# Patient Record
Sex: Female | Born: 1985
Health system: Southern US, Community
[De-identification: ages and names within clinical notes are randomized; demographics above are authoritative.]

## PROBLEM LIST (undated history)

## (undated) DIAGNOSIS — N159 Renal tubulo-interstitial disease, unspecified: Secondary | ICD-10-CM

## (undated) DIAGNOSIS — T7840XA Allergy, unspecified, initial encounter: Secondary | ICD-10-CM

## (undated) DIAGNOSIS — N809 Endometriosis, unspecified: Secondary | ICD-10-CM

## (undated) DIAGNOSIS — J45909 Unspecified asthma, uncomplicated: Secondary | ICD-10-CM

## (undated) DIAGNOSIS — E119 Type 2 diabetes mellitus without complications: Secondary | ICD-10-CM

## (undated) HISTORY — PX: HYSTEROSCOPY: SHX211

## (undated) HISTORY — DX: Allergy, unspecified, initial encounter: T78.40XA

## (undated) HISTORY — PX: LAPAROSCOPY: SHX197

---
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
## 2006-01-07 ENCOUNTER — Observation Stay: Payer: Self-pay

## 2006-01-09 ENCOUNTER — Inpatient Hospital Stay: Payer: Self-pay | Admitting: Obstetrics & Gynecology

## 2006-03-15 ENCOUNTER — Emergency Department: Payer: Self-pay

## 2007-02-28 ENCOUNTER — Emergency Department: Payer: Self-pay | Admitting: Emergency Medicine

## 2007-07-05 ENCOUNTER — Emergency Department: Payer: Self-pay | Admitting: Emergency Medicine

## 2007-07-07 ENCOUNTER — Emergency Department: Payer: Self-pay | Admitting: Emergency Medicine

## 2007-10-09 ENCOUNTER — Emergency Department: Payer: Self-pay | Admitting: Emergency Medicine

## 2007-10-26 ENCOUNTER — Emergency Department: Payer: Self-pay | Admitting: Emergency Medicine

## 2008-03-25 ENCOUNTER — Emergency Department: Payer: Self-pay | Admitting: Emergency Medicine

## 2008-03-30 ENCOUNTER — Encounter: Payer: Self-pay | Admitting: Obstetrics & Gynecology

## 2008-04-06 ENCOUNTER — Encounter: Payer: Self-pay | Admitting: Obstetrics and Gynecology

## 2008-05-04 ENCOUNTER — Encounter: Payer: Self-pay | Admitting: Obstetrics & Gynecology

## 2008-05-25 ENCOUNTER — Emergency Department: Payer: Self-pay

## 2008-06-08 ENCOUNTER — Encounter: Payer: Self-pay | Admitting: Maternal and Fetal Medicine

## 2008-06-22 ENCOUNTER — Emergency Department: Payer: Self-pay | Admitting: Emergency Medicine

## 2008-07-27 ENCOUNTER — Observation Stay: Payer: Self-pay

## 2008-08-27 ENCOUNTER — Encounter: Payer: Self-pay | Admitting: Obstetrics and Gynecology

## 2008-09-02 ENCOUNTER — Observation Stay: Payer: Self-pay | Admitting: Obstetrics and Gynecology

## 2008-09-29 ENCOUNTER — Observation Stay: Payer: Self-pay | Admitting: Obstetrics and Gynecology

## 2008-10-01 ENCOUNTER — Observation Stay: Payer: Self-pay

## 2008-10-11 ENCOUNTER — Observation Stay: Payer: Self-pay

## 2008-10-13 ENCOUNTER — Observation Stay: Payer: Self-pay | Admitting: Obstetrics & Gynecology

## 2008-10-22 ENCOUNTER — Observation Stay: Payer: Self-pay | Admitting: Unknown Physician Specialty

## 2008-11-03 ENCOUNTER — Inpatient Hospital Stay: Payer: Self-pay

## 2010-12-04 ENCOUNTER — Emergency Department: Payer: Self-pay | Admitting: Emergency Medicine

## 2011-02-02 IMAGING — US US OB NUCHAL TRANSLUCENCY 1ST GEST - [facility] NRPT
1 series · 14 of 26 positions shown · non-contrast
Comparison: none

[Series 1: us ob nuchal translucency 1st gest - [facility] nrpt · 14 of 26 slices shown]
[im 1/26]
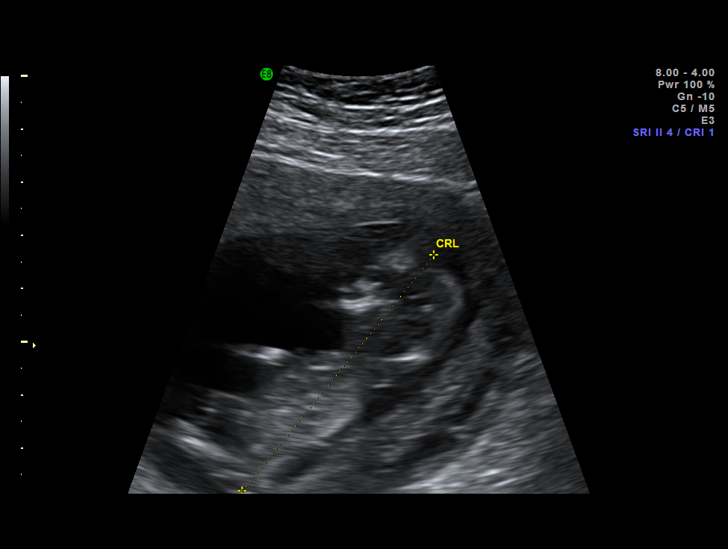
[im 3/26]
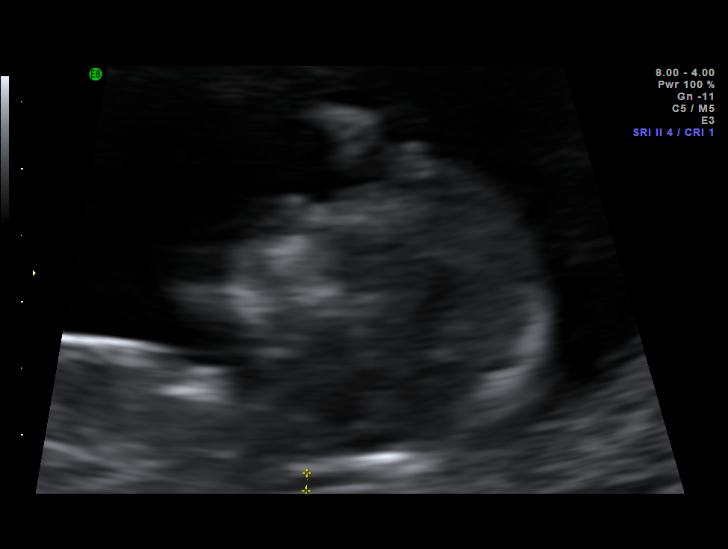
[im 5/26]
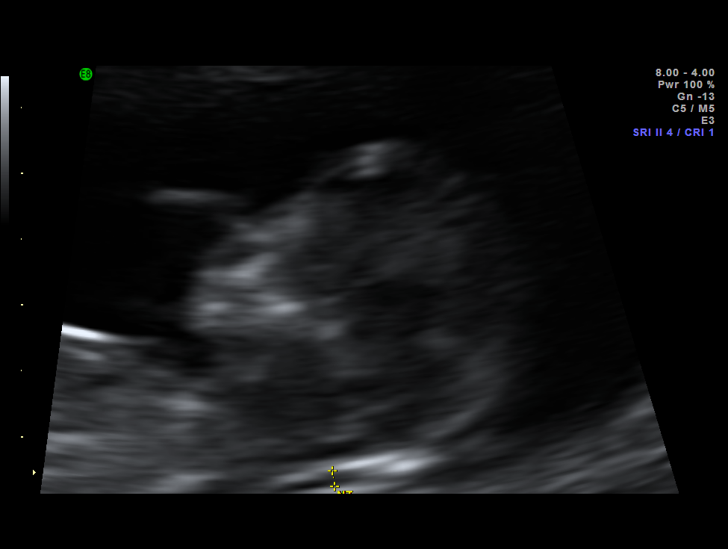
[im 7/26]
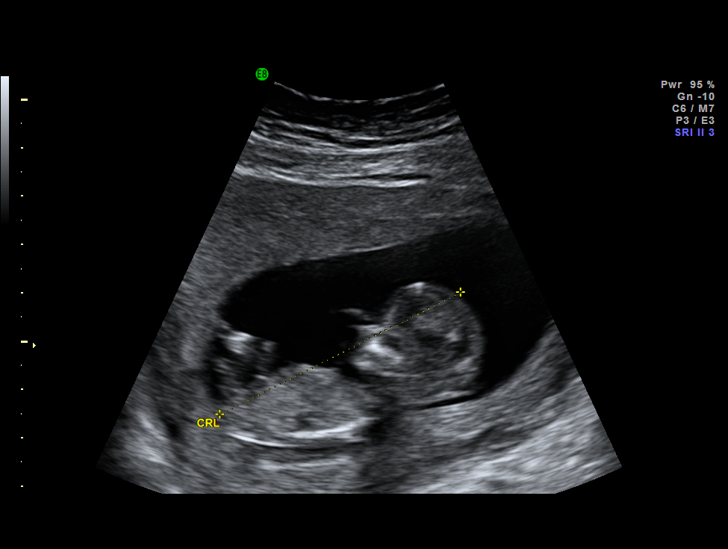
[im 9/26]
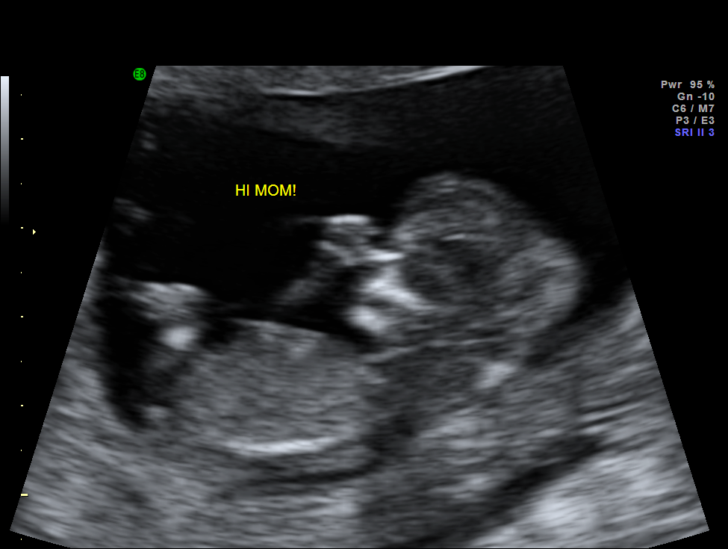
[im 11/26]
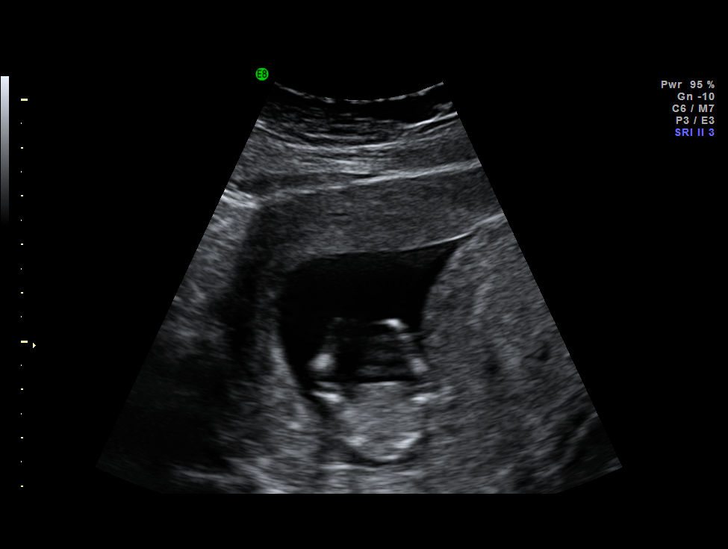
[im 13/26]
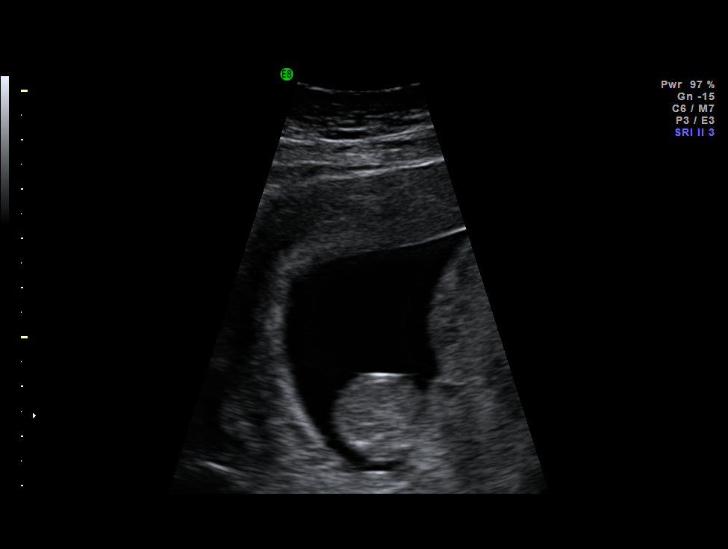
[im 14/26]
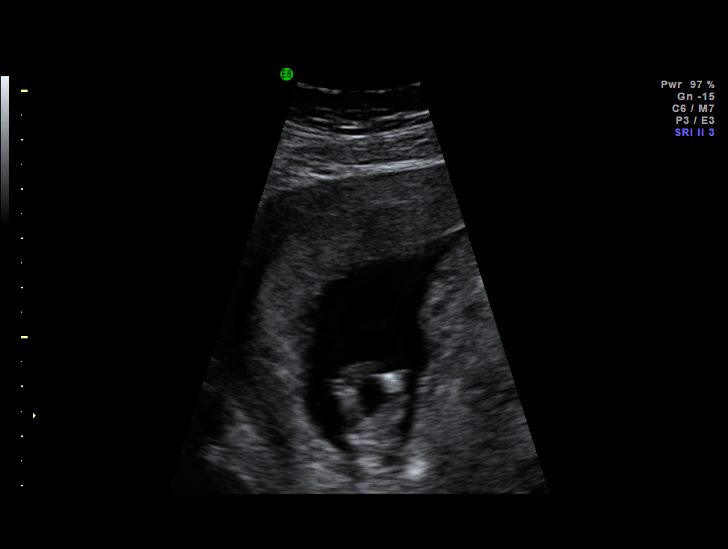
[im 16/26]
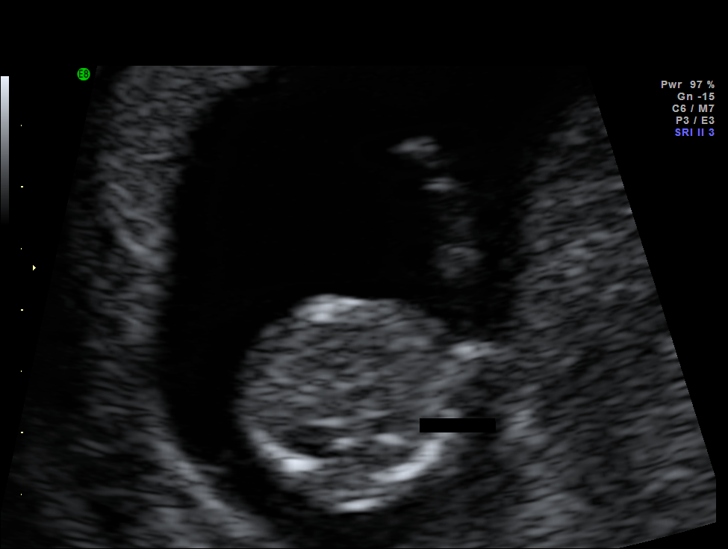
[im 18/26]
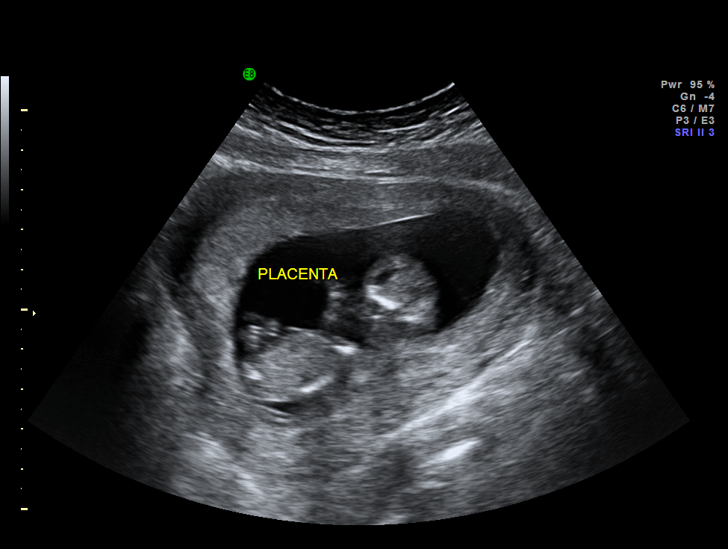
[im 20/26]
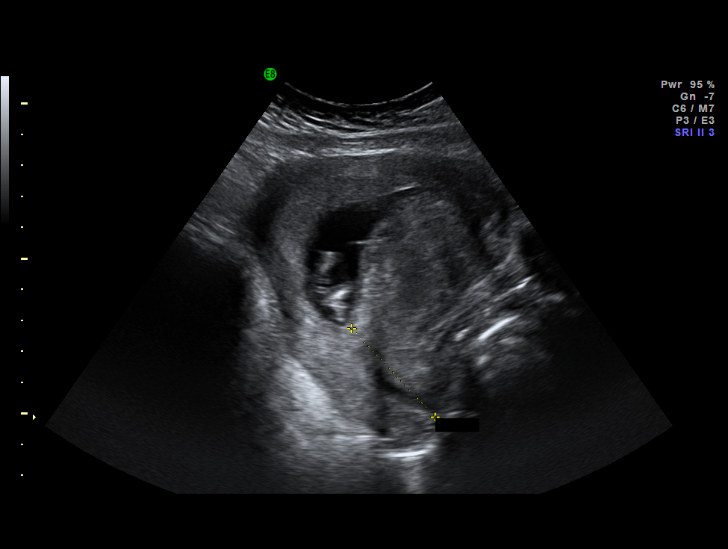
[im 22/26]
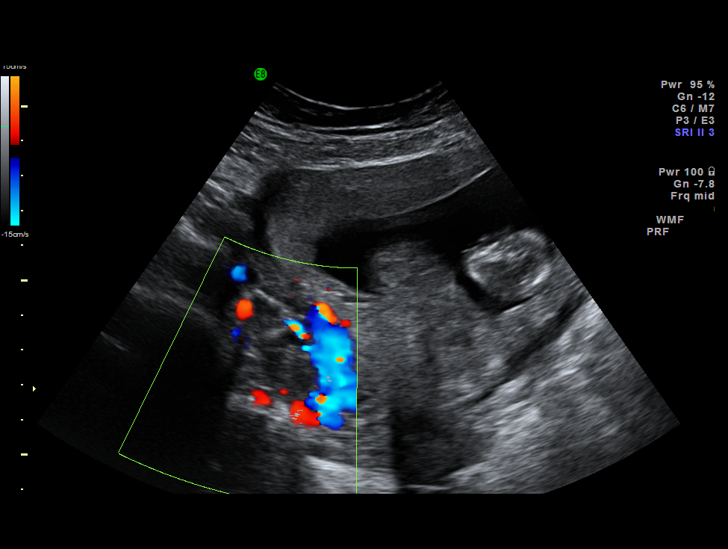
[im 24/26]
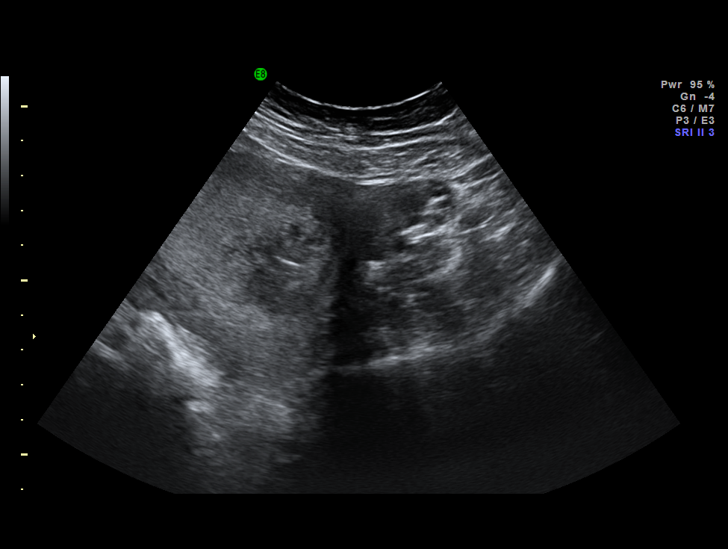
[im 26/26]
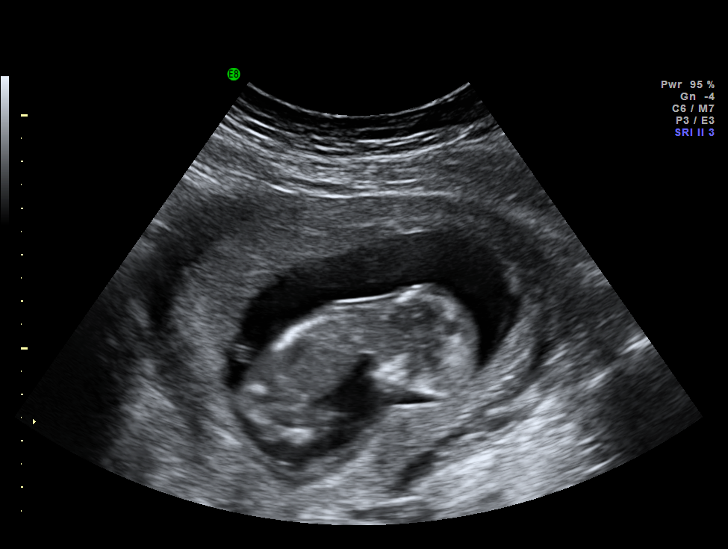

[14 of 26 positions shown; findings below may reference images not displayed]

IMAGES IMPORTED FROM THE [employer] WORKFLOW SYSTEM
NO DICTATION FOR STUDY

## 2011-03-18 ENCOUNTER — Emergency Department: Payer: Self-pay | Admitting: Emergency Medicine

## 2011-03-18 LAB — URINALYSIS, COMPLETE
Bacteria: NONE SEEN
Bilirubin,UR: NEGATIVE
Glucose,UR: NEGATIVE mg/dL (ref 0–75)
RBC,UR: 4 /HPF (ref 0–5)
Specific Gravity: 1.027 (ref 1.003–1.030)
Squamous Epithelial: 5
WBC UR: 5 /HPF (ref 0–5)

## 2011-03-19 LAB — WET PREP, GENITAL

## 2012-01-16 ENCOUNTER — Emergency Department: Payer: Self-pay | Admitting: Emergency Medicine

## 2012-01-16 LAB — RAPID INFLUENZA A&B ANTIGENS

## 2012-05-30 ENCOUNTER — Emergency Department: Payer: Self-pay | Admitting: Emergency Medicine

## 2012-05-30 LAB — COMPREHENSIVE METABOLIC PANEL
Albumin: 3.9 g/dL (ref 3.4–5.0)
Alkaline Phosphatase: 65 U/L (ref 50–136)
BUN: 16 mg/dL (ref 7–18)
Chloride: 108 mmol/L — ABNORMAL HIGH (ref 98–107)
Creatinine: 0.68 mg/dL (ref 0.60–1.30)
EGFR (Non-African Amer.): >60
Glucose: 97 mg/dL (ref 65–99)
Osmolality: 284 (ref 275–301)
Potassium: 3.8 mmol/L (ref 3.5–5.1)
SGOT(AST): 24 U/L (ref 15–37)
SGPT (ALT): 25 U/L (ref 12–78)
Total Protein: 7.5 g/dL (ref 6.4–8.2)

## 2012-05-30 LAB — URINALYSIS, COMPLETE
Glucose,UR: NEGATIVE mg/dL (ref 0–75)
Protein: 100
RBC,UR: 945 /HPF (ref 0–5)
Specific Gravity: 1.013 (ref 1.003–1.030)
Squamous Epithelial: 2
WBC UR: 197 /HPF (ref 0–5)

## 2012-05-30 LAB — LIPASE, BLOOD: Lipase: 156 U/L (ref 73–393)

## 2012-05-31 LAB — CBC
HCT: 37.2 % (ref 35.0–47.0)
HGB: 12.7 g/dL (ref 12.0–16.0)
MCH: 30 pg (ref 26.0–34.0)
MCV: 88 fL (ref 80–100)
Platelet: 243 10*3/uL (ref 150–440)
RDW: 13.4 % (ref 11.5–14.5)

## 2012-08-12 ENCOUNTER — Emergency Department: Payer: Self-pay | Admitting: Emergency Medicine

## 2012-08-12 LAB — COMPREHENSIVE METABOLIC PANEL
Albumin: 4 g/dL (ref 3.4–5.0)
Alkaline Phosphatase: 60 U/L (ref 50–136)
Bilirubin,Total: 0.3 mg/dL (ref 0.2–1.0)
Calcium, Total: 9.3 mg/dL (ref 8.5–10.1)
Co2: 30 mmol/L (ref 21–32)
Creatinine: 0.7 mg/dL (ref 0.60–1.30)
EGFR (African American): >60
EGFR (Non-African Amer.): >60
Glucose: 88 mg/dL (ref 65–99)
Potassium: 3.9 mmol/L (ref 3.5–5.1)
SGOT(AST): 23 U/L (ref 15–37)
SGPT (ALT): 22 U/L (ref 12–78)
Sodium: 137 mmol/L (ref 136–145)
Total Protein: 7.4 g/dL (ref 6.4–8.2)

## 2012-08-12 LAB — URINALYSIS, COMPLETE
Bilirubin,UR: NEGATIVE
Ketone: NEGATIVE
Ph: 6 (ref 4.5–8.0)
RBC,UR: 6 /HPF (ref 0–5)
Specific Gravity: 1.006 (ref 1.003–1.030)
Squamous Epithelial: 13
WBC UR: 12 /HPF (ref 0–5)

## 2012-08-12 LAB — CBC
HCT: 36.7 % (ref 35.0–47.0)
HGB: 12.8 g/dL (ref 12.0–16.0)
MCH: 30.7 pg (ref 26.0–34.0)
MCHC: 34.9 g/dL (ref 32.0–36.0)
Platelet: 200 10*3/uL (ref 150–440)
RBC: 4.18 10*6/uL (ref 3.80–5.20)
RDW: 12.9 % (ref 11.5–14.5)
WBC: 7.3 10*3/uL (ref 3.6–11.0)

## 2013-06-02 ENCOUNTER — Emergency Department: Payer: Self-pay | Admitting: Emergency Medicine

## 2013-06-02 LAB — URINALYSIS, COMPLETE
BLOOD: NEGATIVE
Bilirubin,UR: NEGATIVE
Glucose,UR: NEGATIVE mg/dL (ref 0–75)
Ketone: NEGATIVE
Leukocyte Esterase: NEGATIVE
Nitrite: NEGATIVE
PH: 6 (ref 4.5–8.0)
Protein: NEGATIVE
RBC,UR: NONE SEEN /HPF (ref 0–5)
Specific Gravity: 1.003 (ref 1.003–1.030)
WBC UR: <1 /HPF (ref 0–5)

## 2013-06-02 LAB — COMPREHENSIVE METABOLIC PANEL
ALBUMIN: 4.2 g/dL (ref 3.4–5.0)
Alkaline Phosphatase: 60 U/L
Anion Gap: 5 — ABNORMAL LOW (ref 7–16)
BUN: 15 mg/dL (ref 7–18)
Bilirubin,Total: 0.5 mg/dL (ref 0.2–1.0)
CHLORIDE: 103 mmol/L (ref 98–107)
CO2: 30 mmol/L (ref 21–32)
CREATININE: 0.64 mg/dL (ref 0.60–1.30)
Calcium, Total: 9.5 mg/dL (ref 8.5–10.1)
EGFR (African American): >60
Glucose: 88 mg/dL (ref 65–99)
Osmolality: 276 (ref 275–301)
Potassium: 3.7 mmol/L (ref 3.5–5.1)
SGOT(AST): 17 U/L (ref 15–37)
SGPT (ALT): 18 U/L (ref 12–78)
Sodium: 138 mmol/L (ref 136–145)
Total Protein: 7.6 g/dL (ref 6.4–8.2)

## 2013-06-02 LAB — CBC
HCT: 39.6 % (ref 35.0–47.0)
HGB: 13.4 g/dL (ref 12.0–16.0)
MCH: 30 pg (ref 26.0–34.0)
MCHC: 33.8 g/dL (ref 32.0–36.0)
MCV: 89 fL (ref 80–100)
PLATELETS: 200 10*3/uL (ref 150–440)
RBC: 4.46 10*6/uL (ref 3.80–5.20)
RDW: 13.1 % (ref 11.5–14.5)
WBC: 4.2 10*3/uL (ref 3.6–11.0)

## 2013-06-02 LAB — WET PREP, GENITAL

## 2013-06-04 LAB — URINE CULTURE

## 2014-02-10 ENCOUNTER — Emergency Department: Payer: Self-pay | Admitting: Emergency Medicine

## 2014-03-07 ENCOUNTER — Inpatient Hospital Stay: Payer: Self-pay | Admitting: Internal Medicine

## 2014-05-03 NOTE — Discharge Summary (Signed)
PATIENT NAME:  Nichole Boyd, Nichole Boyd MR#:  [id number] DATE OF BIRTH:  [birth year]  DATE OF ADMISSION:  03/07/2014 DATE OF DISCHARGE:  03/09/2014  PRIMARY CARE PHYSICIAN: Dr. Dr. Yetta BarreJones.  DISCHARGE DIAGNOSES:  1.  Left maxillofacial cellulitis following wisdom teeth extraction.  2.  Anxiety.   CONDITION: Stable.   CODE STATUS: Full code.   HOME MEDICATIONS: Please refer to the medication reconciliation list.   DIET: Low-fat, low-cholesterol diet.   ACTIVITY: As tolerated.   FOLLOWUP CARE:  With PCP within 1-2 weeks.   REASON FOR ADMISSION:  Pain and swelling on the left side of the face following wisdom teeth extraction, 24 hours.   HOSPITAL COURSE: The patient is a [age] year old Caucasian female with a history of anxiety and endometriosis, presented to the ED with pain and swelling on the left side of face. For detailed history and physical examination, please refer to the admission note dictated by Dr. Betti Cruzeddy.   The patient was found to have significant swelling and tenderness on the left side of the face extending to upper part of the neck.  CAT scan of facial, neck survey, recent, revealed significant soft tissue swelling consistent with cellulitis and negative for any abscess or Ludwig angina. The patient's WBC was normal. On-call ENT physician suggested the patient needs admission and start IV antibiotics and Decadron. After admission, the patient has been treated with clindamycin IV and Decadron.  The patient also has been treated with pain medication p.r.Boyd.   The patient's swelling and tenderness on the left face has much improved, she still has some mild tenderness and swelling on the left side of the face, but no neck tenderness or periorbital tenderness.  No dysphagia or shortness of breath or slurred speech. The patient's vital signs are stable.  She is clinically stable and will be discharged to home today. I discussed the discharge plan with the patient and the patient's mother and the  nurse.   TIME SPENT: About 32 minutes.     ____________________________ Shaune PollackQing Sammye Staff, MD qc:LT D: 03/09/2014 16:01:58 ET T: 03/09/2014 19:21:08 ET JOB#: [id number]  cc: Shaune PollackQing Kiaira Pointer, MD, <Dictator> Shaune PollackQING Erminio Nygard MD ELECTRONICALLY SIGNED 03/11/2014 14:26

## 2014-05-03 NOTE — H&P (Signed)
PATIENT NAME:  Nichole Boyd, Nichole Boyd MR#:  [id number] DATE OF BIRTH:  20-Jul-1985  DATE OF ADMISSION:  03/07/2014  REFERRING DOCTOR:  Eartha Inchory R. York CeriseForbach, MD    PRIMARY CARE PRACTITIONER:  Dr. Yetta BarreJones    ADMITTING DOCTOR:  Crissie FiguresEdavally Boyd. Sama Arauz, MD    CHIEF COMPLAINT: Pain and swelling of the left side of the face following wisdom teeth extraction 24 hours ago.    HISTORY OF PRESENT ILLNESS: A 29 year old Caucasian female with a history of anxiety disorder and endometriosis presents to the Emergency Room with the complaints of worsening pain with associated swelling on the left side of the face following wisdom teeth extraction, which was done about 24 hours ago. According to the patient's mother who is with patient at this time, patient underwent bilateral wisdom teeth extraction a day prior and following which she started having pain and swelling on the left side of the face which further worsened today, hence came to the Emergency Room for further evaluation.   In the Emergency Room, the patient was evaluated by the ED physician and was found to have significant swelling and tenderness of the left side of the face extending into the upper part of the neck.  The patient on further workup with a CT scan of the facial maxillary region revealed significant soft tissue swelling consistent with cellulitis and negative for any abscess or Ludwig angina. Lab work essentially had normal white blood cell count. ENT on-call was consulted by the ED physician and who recommended the patient to be admitted to the hospitalist team for administration of IV antibiotics, IV Decadron, and pain control measures at least for 24 hours. Hence, hospitalist service was consulted for further evaluation and management. The patient denies any fever or chills. No chest pain. No shortness of breath. No nausea, vomiting, diarrhea. No abdominal pain. No urinary symptoms. The patient has significant left facial swelling and not able to open her  mouth completely and limited in speech because of the significant pain. The patient received IV pain control medications and the pain is under reasonable control at this time.   PAST MEDICAL HISTORY:  1.  Anxiety disorder.  2.  Endometriosis.   PAST SURGICAL HISTORY:  1.  Hysteroscopy.  2.  Laparoscopic.   ALLERGIES: AMOXICILLIN, WHICH CAUSES HIVES.   HOME MEDICATIONS: Used some medications for depression in the past but currently off medications for her anxiety, depression.    FAMILY HISTORY: Significant for father with coronary artery disease who died at the age of 28 years age.   SOCIAL HISTORY: She is single, works as a Public affairs consultantfinance consultant.History of smoking in the past, quit about 2 years ago. Occasional alcohol intake history. Negative for any substance abuse.    REVIEW OF SYSTEMS:  CONSTITUTIONAL: Negative for fever or chills. No fatigue. No generalized weakness.  EYES: Negative for blurred vision, double vision. No pain. No redness. No discharge.  EARS, NOSE, AND THROAT: Positive for pain and significant swelling of the left side of the face with inability to open the mouth widely secondary due to pain.   RESPIRATORY: Negative for cough, wheezing, dyspnea, hemoptysis, painful respiration.  CARDIOVASCULAR: Negative for chest pain, palpitation, dizziness, syncopal episodes, orthopnea, dyspnea on exertion, pedal edema.  GASTROINTESTINAL: Negative for nausea, vomiting, diarrhea, abdominal pain, hematemesis, melena, rectal bleeding, GERD symptoms.   GENITOURINARY: Negative for dysuria, frequency, urgency, hematuria.  ENDOCRINE: Negative for polyuria, nocturia, heat or cold intolerance.  HEMATOLOGY AND LYMPHATICS: Negative for anemia, easy bruising, bleeding.  INTEGUMENTARY:  Negative for acne, skin rash, or lesions.  MUSCULOSKELETAL: Pain on the left side of the face as noted in the history of present illness. No history of arthritis or gout.  NEUROLOGICAL: Negative for focal weakness  or numbness. No history of CVA, TIA, seizure disorder.  PSYCHIATRIC: Positive for anxiety disorder, not on any medications at present.   PHYSICAL EXAMINATION:  VITAL SIGNS: Temperature 98.8 degrees Fahrenheit, pulse rate 95 per minute, respirations 20 per minute, blood pressure 110/62, O2 saturation is 90% on room air.  GENERAL: Well developed, well nourished, alert, in moderate distress because of the facial pain.  HEAD: Atraumatic, normocephalic.  EYES: Pupils equal, react to light and accommodation. No conjunctival pallor. No icterus. Extraocular movements intact.  NOSE: No drainage. No lesions.  EARS: No drainage. No external lesions.   ORAL CAVITY: Able to open the mouth limited because of the pain. Detailed examination not possible of the oral cavity because of patient's inability to open the mouth wide.  NECK: Supple. Facial swelling extending to upper part of the neck. Mild local tenderness present. No carotid bruit. Range of motion restricted because of the facial pain.  RESPIRATORY: Good respiratory effort. Not using accessory muscles of respiration. Bilateral vesicular breath sounds present. No rales or rhonchi.  CARDIOVASCULAR: S1, S2 regular. No murmurs, gallops, clicks. Peripheral pulses equal at carotid, femoral, and pedal pulses. No peripheral edema.  GASTROINTESTINAL: Abdomen soft, nontender. No hepatosplenomegaly. No masses. No rigidity. No guarding. Bowel sounds present and equal in all 4 quadrants.  GENITOURINARY: Deferred.  MUSCULOSKELETAL: No joint tenderness or effusion. Range of motion adequate. Strength and tone equal bilaterally.  SKIN: Inspection within normal limits. No obvious wounds.  LYMPHATIC: No cervical lymphadenopathy.  VASCULAR: Good dorsalis pedis, posterior tibial pulses.  NEUROLOGICAL: Alert, awake, and oriented x 3. Cranial nerves II through XII grossly intact. No sensory deficit. Motor strength 5/5 in both upper and lower extremities. Plantars downgoing.   PSYCHIATRIC: Alert, awake, and oriented x 3. Judgment, insight adequate. Memory, mood within normal limits.   ANCILLARY DATA:  LABORATORY DATA: Urine pregnancy  negative. Serum glucose 93, BUN 7, creatinine 0.83, potassium 3.6, chloride 106, bicarbonate 30, total calcium 8.4, total protein 6.6, albumin 3.3, total bilirubin 0.3, alkaline phosphatase 43, AST 20, ALT 18, WBC 9.4, hemoglobin 10.9, platelet count 192.  IMAGING STUDIES: CT maxillofacial area with contrast:  1.  Scattered soft tissue swelling from the left maxillary wisdom tooth extraction defect over the left maxilla with diffuse left-sided facial swelling. Soft tissue swelling extending inferiorly to the level of the chin and superiorly to the level of the left zygomatic arch. No evidence of abscess.  2.  Mildly diffuse soft tissue inflammation noted at the parapharyngeal fat planes.  3.  Right maxillary wisdom tooth extraction defect is grossly unremarkable in appearance. There is minimal right-sided soft tissue edema of buccal fat pad.   ASSESSMENT AND PLAN: A 29 year old Caucasian female with a history of anxiety disorder, endometriosis presents with the complaints of pain and swelling of the left side of the face 24 hours following bilateral wisdom teeth extraction, found to have cellulitis on the computed tomography scan with no abscess and Ludwig angina.   1.  Left maxillofacial cellulitis following wisdom teeth extraction. CT consistent with cellulitis, negative for abscess, and Ludwig angina.  ED physician spoke with ENT on-call and advised admission for IV antibiotics and IV Decadron and IV pain control medications for 24 hours. Plan: Admit to medical floor, IV antibiotics, ceftriaxone and clindamycin,  IV Decadron q. 8 hours and IV pain control medications. Per ENT, may change the antibiotics to oral after 24 hours if improvement is there. Consider repeat CT scan of the face, facial maxillary region if no improvement or worsening of  symptoms, and according to the ED physician, ENT recommends not to put in a formal consult at this time unless, until there is worsening of symptoms or no improvement with the treatment.   2.  Deep vein thrombosis prophylaxis, subcutaneous Lovenox.  3.  Gastrointestinal prophylaxis with ranitidine.   CODE STATUS: Full code.   TIME SPENT: 40 minutes.   ____________________________ Crissie Figures, MD enr:AT D: 03/07/2014 06:31:33 ET T: 03/07/2014 06:53:39 ET JOB#: [id number]  cc: Crissie Figures, MD, <Dictator> Dr. Madelaine Bhat Annesha Delgreco MD ELECTRONICALLY SIGNED 03/08/2014 7:04

## 2015-05-17 ENCOUNTER — Encounter: Payer: Self-pay | Admitting: *Deleted

## 2015-05-17 ENCOUNTER — Emergency Department
Admission: EM | Admit: 2015-05-17 | Discharge: 2015-05-17 | Disposition: A | Discharge status: Home / Self Care | Payer: BLUE CROSS/BLUE SHIELD | Attending: Emergency Medicine | Admitting: Emergency Medicine

## 2015-05-17 ENCOUNTER — Emergency Department: Payer: BLUE CROSS/BLUE SHIELD

## 2015-05-17 DIAGNOSIS — R1084 Generalized abdominal pain: Secondary | ICD-10-CM | POA: Diagnosis present

## 2015-05-17 DIAGNOSIS — K529 Noninfective gastroenteritis and colitis, unspecified: Secondary | ICD-10-CM | POA: Insufficient documentation

## 2015-05-17 LAB — COMPREHENSIVE METABOLIC PANEL
ALK PHOS: 51 U/L (ref 38–126)
ALT: 18 U/L (ref 14–54)
AST: 22 U/L (ref 15–41)
Albumin: 4.7 g/dL (ref 3.5–5.0)
Anion gap: 11 (ref 5–15)
BUN: 18 mg/dL (ref 6–20)
CALCIUM: 9.9 mg/dL (ref 8.9–10.3)
CO2: 22 mmol/L (ref 22–32)
CREATININE: 0.66 mg/dL (ref 0.44–1.00)
Chloride: 103 mmol/L (ref 101–111)
Glucose, Bld: 101 mg/dL — ABNORMAL HIGH (ref 65–99)
Potassium: 3.5 mmol/L (ref 3.5–5.1)
Sodium: 136 mmol/L (ref 135–145)
Total Bilirubin: 0.8 mg/dL (ref 0.3–1.2)
Total Protein: 7.9 g/dL (ref 6.5–8.1)

## 2015-05-17 LAB — URINALYSIS COMPLETE WITH MICROSCOPIC (ARMC ONLY)
BACTERIA UA: NONE SEEN
Bilirubin Urine: NEGATIVE
Glucose, UA: NEGATIVE mg/dL
HGB URINE DIPSTICK: NEGATIVE
KETONES UR: NEGATIVE mg/dL
Leukocytes, UA: NEGATIVE
NITRITE: NEGATIVE
PH: 7 (ref 5.0–8.0)
PROTEIN: NEGATIVE mg/dL
RBC / HPF: NONE SEEN RBC/hpf (ref 0–5)
SPECIFIC GRAVITY, URINE: 1.003 — AB (ref 1.005–1.030)

## 2015-05-17 LAB — CBC
HCT: 39.4 % (ref 35.0–47.0)
Hemoglobin: 13.7 g/dL (ref 12.0–16.0)
MCH: 29.5 pg (ref 26.0–34.0)
MCHC: 34.9 g/dL (ref 32.0–36.0)
MCV: 84.6 fL (ref 80.0–100.0)
PLATELETS: 225 10*3/uL (ref 150–440)
RBC: 4.66 MIL/uL (ref 3.80–5.20)
RDW: 13.2 % (ref 11.5–14.5)
WBC: 5.4 10*3/uL (ref 3.6–11.0)

## 2015-05-17 LAB — LIPASE, BLOOD: LIPASE: 29 U/L (ref 11–51)

## 2015-05-17 LAB — POCT PREGNANCY, URINE: PREG TEST UR: NEGATIVE

## 2015-05-17 MED ORDER — ONDANSETRON HCL 4 MG/2ML IJ SOLN
4.0000 mg | Freq: Once | INTRAMUSCULAR | Status: AC
  Administered 2015-05-17: 4 mg via INTRAVENOUS
  Filled 2015-05-17: qty 2

## 2015-05-17 MED ORDER — ONDANSETRON 4 MG PO TBDP
ORAL_TABLET | ORAL | Status: AC
  Filled 2015-05-17: qty 1

## 2015-05-17 MED ORDER — IOPAMIDOL (ISOVUE-300) INJECTION 61%
100.0000 mL | Freq: Once | INTRAVENOUS | Status: AC | PRN
  Administered 2015-05-17: 100 mL via INTRAVENOUS
  Filled 2015-05-17: qty 100

## 2015-05-17 MED ORDER — HYDROMORPHONE HCL 1 MG/ML IJ SOLN
1.0000 mg | Freq: Once | INTRAMUSCULAR | Status: AC
  Administered 2015-05-17: 1 mg via INTRAVENOUS
  Filled 2015-05-17: qty 1

## 2015-05-17 MED ORDER — SODIUM CHLORIDE 0.9 % IV BOLUS (SEPSIS)
1000.0000 mL | Freq: Once | INTRAVENOUS | Status: AC
  Administered 2015-05-17: 1000 mL via INTRAVENOUS

## 2015-05-17 MED ORDER — ONDANSETRON 4 MG PO TBDP
4.0000 mg | ORAL_TABLET | Freq: Once | ORAL | Status: AC | PRN
  Administered 2015-05-17: 4 mg via ORAL

## 2015-05-17 MED ORDER — TRAMADOL HCL 50 MG PO TABS: 50.0000 mg | ORAL_TABLET | Freq: Four times a day (QID) | ORAL | Status: AC | PRN

## 2015-05-17 MED ORDER — ONDANSETRON 4 MG PO TBDP: 4.0000 mg | ORAL_TABLET | Freq: Three times a day (TID) | ORAL | Status: DC | PRN

## 2015-05-17 MED ORDER — DIATRIZOATE MEGLUMINE & SODIUM 66-10 % PO SOLN
15.0000 mL | Freq: Once | ORAL | Status: AC
  Administered 2015-05-17: 15 mL via ORAL

## 2015-05-17 NOTE — ED Notes (Signed)
Pt states " I don't want anything for pain"

## 2015-05-17 NOTE — ED Notes (Addendum)
Pt complains of abdominal pain with nausea and vomiting starting yesterday, pain is below umbilicus

## 2015-05-17 NOTE — ED Provider Notes (Signed)
[facility] Emergency Department Provider Note  ____________________________________________   I have reviewed the triage vital signs and the nursing notes.   HISTORY  Chief Complaint Abdominal Pain    HPI Nichole Boyd is a 30 y.o. female presents today with nausea vomiting and diarrhea, as well as right-sided abdominal pain mostly on the upper and mid abdomen. She has had no fever. Started yesterday. Cramping discomfort. No lower abdominal pain no vaginal discharge. Denies pregnancy. No fever. States the pain is sometimes severe when the cramping gets "bad" history of endometriosis but none since her last surgery for it. That was over 10 years ago. Multiple different negative CT scans for recurrent abdominal pain in the past. Patient has had no melena no bright red blood per rectum. The pain is worse right before she has a bowel movement and seems to release. She vomited a few times this morning, has not vomited since. Watery diarrhea multiple times.     History reviewed. No pertinent past medical history.  There are no active problems to display for this patient.   History reviewed. No pertinent past surgical history.  Current Outpatient Rx  Name  Route  Sig  Dispense  Refill  . ondansetron (ZOFRAN ODT) 4 MG disintegrating tablet   Oral   Take 1 tablet (4 mg total) by mouth every 8 (eight) hours as needed for nausea or vomiting.   20 tablet   0   . traMADol (ULTRAM) 50 MG tablet   Oral   Take 1 tablet (50 mg total) by mouth every 6 (six) hours as needed.   6 tablet   0     Allergies Amoxicillin  No family history on file.  Social History Social History  Substance Use Topics  . Smoking status: Never Smoker   . Smokeless tobacco: None  . Alcohol Use: No    Review of Systems Constitutional: No fever/chills Eyes: No visual changes. ENT: No sore throat. No stiff neck no neck pain Cardiovascular: Denies chest pain. Respiratory:  Denies shortness of breath. Gastrointestinal:   See history of present illness Genitourinary: Negative for dysuria. Musculoskeletal: Negative lower extremity swelling Skin: Negative for rash. Neurological: Negative for headaches, focal weakness or numbness. 10-point ROS otherwise negative.  ____________________________________________   PHYSICAL EXAM:  VITAL SIGNS: ED Triage Vitals  Enc Vitals Group     BP 05/17/15 1055 110/58 mmHg     Pulse Rate 05/17/15 1055 77     Resp 05/17/15 1055 15     Temp 05/17/15 1055 98.3 F (36.8 C)     Temp Source 05/17/15 1055 Oral     SpO2 05/17/15 1055 100 %     Weight 05/17/15 1055 150 lb (68.04 kg)     Height 05/17/15 1055 5\' 3"  (1.6 m)     Head Cir --      Peak Flow --      Pain Score 05/17/15 1108 8     Pain Loc --      Pain Edu? --      Excl. in GC? --     Constitutional: Alert and oriented. Anxious and upset but nontoxic Eyes: Conjunctivae are normal. PERRL. EOMI. Head: Atraumatic. Nose: No congestion/rhinnorhea. Mouth/Throat: Mucous membranes are moist.  Oropharynx non-erythematous. Neck: No stridor.   Nontender with no meningismus Cardiovascular: Normal rate, regular rhythm. Grossly normal heart sounds.  Good peripheral circulation. Respiratory: Normal respiratory effort.  No retractions. Lungs CTAB. Abdominal: Soft and focal tenderness in the epigastric and  right upper and mid right abdomen. No distention. No guarding no rebound Back:  There is no focal tenderness or step off there is no midline tenderness there are no lesions noted. there is no CVA tenderness Musculoskeletal: No lower extremity tenderness. No joint effusions, no DVT signs strong distal pulses no edema Neurologic:  Normal speech and language. No gross focal neurologic deficits are appreciated.  Skin:  Skin is warm, dry and intact. No rash noted. Psychiatric: Mood and affect are normal. Speech and behavior are  normal.  ____________________________________________   LABS (all labs ordered are listed, but only abnormal results are displayed)  Labs Reviewed  COMPREHENSIVE METABOLIC PANEL - Abnormal; Notable for the following:    Glucose, Bld 101 (*)    All other components within normal limits  URINALYSIS COMPLETEWITH MICROSCOPIC ([facility] ONLY) - Abnormal; Notable for the following:    Color, Urine STRAW (*)    APPearance CLEAR (*)    Specific Gravity, Urine 1.003 (*)    Squamous Epithelial / LPF 0-5 (*)    All other components within normal limits  LIPASE, BLOOD  CBC  POC URINE PREG, ED  POCT PREGNANCY, URINE   ____________________________________________  EKG  I personally interpreted any EKGs ordered by me or triage  ____________________________________________  RADIOLOGY  I reviewed any imaging ordered by me or triage that were performed during my shift and, if possible, patient and/or family made aware of any abnormal findings. ____________________________________________   PROCEDURES  Procedure(s) performed: None  Critical Care performed: None  ____________________________________________   INITIAL IMPRESSION / ASSESSMENT AND PLAN / ED COURSE  Pertinent labs & imaging results that were available during my care of the patient were reviewed by me and considered in my medical decision making (see chart for details).  Low suspicion for acute intra-abdominal pathology such as diverticulitis the patient was completely pre-significant discomfort and we did obtain imaging which fortunately was negative. Nothing to suggest PID, blood work vital signs are reassuring she is tolerating by mouth in the emergency department and is eager to go home. Return precautions and follow-up given and understood. ____________________________________________   FINAL CLINICAL IMPRESSION(S) / ED DIAGNOSES  Final diagnoses:  Gastroenteritis  Generalized abdominal pain      This chart was  dictated using voice recognition software.  Despite best efforts to proofread,  errors can occur which can change meaning.     Jeanmarie Plant, MD 05/17/15 [phone]

## 2015-05-17 NOTE — Discharge Instructions (Signed)

## 2016-06-04 ENCOUNTER — Ambulatory Visit
Admission: EM | Admit: 2016-06-04 | Discharge: 2016-06-04 | Disposition: A | Discharge status: Home / Self Care | Payer: BLUE CROSS/BLUE SHIELD | Attending: Family Medicine | Admitting: Family Medicine

## 2016-06-04 DIAGNOSIS — B029 Zoster without complications: Secondary | ICD-10-CM

## 2016-06-04 MED ORDER — VALACYCLOVIR HCL 1 G PO TABS: 1000.0000 mg | ORAL_TABLET | Freq: Three times a day (TID) | ORAL | 0 refills | Status: DC

## 2016-06-04 NOTE — ED Provider Notes (Signed)
[facility]    CSN: [id number] Arrival date & time: 06/04/16  1347  History   Chief Complaint Chief Complaint  Patient presents with  . Abscess   HPI  31 year old female presents with an area of concern on her right buttock.  Patient reports that she had an area on her right buttock that was slightly bothersome on Wednesday. It seems to be worsening. Painful when she applies pressure. She has taken a picture of the area and it appears that there are several vesicles in this region. Some erythema. She's had no fevers or chills. She does report increase in stress. She also states that she goes to the tanning bed frequently. She is unsure of the cause of this. She is requesting evaluation and treatment today. No other associated symptoms. No other complaints at this time.  History reviewed. No pertinent past medical history.  There are no active problems to display for this patient.   History reviewed. No pertinent surgical history.  OB History    No data available       Home Medications    Prior to Admission medications   Medication Sig Start Date End Date Taking? Authorizing Provider  ondansetron (ZOFRAN ODT) 4 MG disintegrating tablet Take 1 tablet (4 mg total) by mouth every 8 (eight) hours as needed for nausea or vomiting. 05/17/15   Jeanmarie PlantMcShane, James A, MD  valACYclovir (VALTREX) 1000 MG tablet Take 1 tablet (1,000 mg total) by mouth 3 (three) times daily. 06/04/16   Tommie Samsook, Darnelle Derrick G, DO    Family History History reviewed. No pertinent family history.  Social History Social History  Substance Use Topics  . Smoking status: Never Smoker  . Smokeless tobacco: Never Used  . Alcohol use No   Allergies   Amoxicillin  Review of Systems Review of Systems  Constitutional: Negative.   Skin:       Skin lesion.   All other systems reviewed and are negative.  Physical Exam Triage Vital Signs ED Triage Vitals  Enc Vitals Group     BP 06/04/16 1409 114/64   Pulse Rate 06/04/16 1409 77     Resp 06/04/16 1409 16     Temp 06/04/16 1409 98.6 F (37 C)     Temp Source 06/04/16 1409 Oral     SpO2 06/04/16 1409 100 %     Weight 06/04/16 1406 145 lb (65.8 kg)     Height 06/04/16 1406 5\' 3"  (1.6 m)     Head Circumference --      Peak Flow --      Pain Score --      Pain Loc --      Pain Edu? --      Excl. in GC? --    No data found.   Updated Vital Signs BP 114/64 (BP Location: Left Arm)   Pulse 77   Temp 98.6 F (37 C) (Oral)   Resp 16   Ht 5\' 3"  (1.6 m)   Wt 145 lb (65.8 kg)   LMP 06/01/2016   SpO2 100%   BMI 25.69 kg/m   Physical Exam  Constitutional: She is oriented to person, place, and time. She appears well-developed. No distress.  HENT:  Head: Normocephalic and atraumatic.  Eyes: Conjunctivae are normal. No scleral icterus.  Neck: Normal range of motion.  Cardiovascular: Normal rate and regular rhythm.   Pulmonary/Chest: Effort normal and breath sounds normal.  Abdominal: She exhibits no distension.  Musculoskeletal: Normal range of motion.  Neurological: She is alert and oriented to person, place, and time.  Skin:  Right buttock - patch of numerous vesicles with mild erythema.  Psychiatric: She has a normal mood and affect.  Vitals reviewed.  UC Treatments / Results  Labs (all labs ordered are listed, but only abnormal results are displayed) Labs Reviewed - No data to display  EKG  EKG Interpretation None       Radiology No results found.  Procedures Procedures (including critical care time)  Medications Ordered in UC Medications - No data to display   Initial Impression / Assessment and Plan / UC Course  I have reviewed the triage vital signs and the nursing notes.  Pertinent labs & imaging results that were available during my care of the patient were reviewed by me and considered in my medical decision making (see chart for details).   31 year old female presents with an area that is  consistent with herpes zoster. Treating with Valtrex.  Final Clinical Impressions(s) / UC Diagnoses   Final diagnoses:  Herpes zoster without complication    New Prescriptions New Prescriptions   VALACYCLOVIR (VALTREX) 1000 MG TABLET    Take 1 tablet (1,000 mg total) by mouth 3 (three) times daily.     Tommie Sams, [place] 06/04/16 1523

## 2016-06-04 NOTE — ED Triage Notes (Signed)
Pt noticed what she is calling a "bug bite" on her buttocks cheek near her anus yesterday. Itchy and burny. Red with white spots on it. "I do go to the tanning bed."

## 2016-08-04 ENCOUNTER — Ambulatory Visit
Admission: EM | Admit: 2016-08-04 | Discharge: 2016-08-04 | Disposition: A | Discharge status: Home / Self Care | Payer: Self-pay | Attending: Family Medicine | Admitting: Family Medicine

## 2016-08-04 DIAGNOSIS — N3001 Acute cystitis with hematuria: Secondary | ICD-10-CM

## 2016-08-04 DIAGNOSIS — M545 Low back pain: Secondary | ICD-10-CM

## 2016-08-04 HISTORY — DX: Renal tubulo-interstitial disease, unspecified: N15.9

## 2016-08-04 HISTORY — DX: Endometriosis, unspecified: N80.9

## 2016-08-04 LAB — URINALYSIS, COMPLETE (UACMP) WITH MICROSCOPIC
BILIRUBIN URINE: NEGATIVE
GLUCOSE, UA: NEGATIVE mg/dL
Ketones, ur: NEGATIVE mg/dL
NITRITE: NEGATIVE
PROTEIN: 30 mg/dL — AB
Specific Gravity, Urine: 1.02 (ref 1.005–1.030)
pH: 7 (ref 5.0–8.0)

## 2016-08-04 LAB — PREGNANCY, URINE: Preg Test, Ur: NEGATIVE

## 2016-08-04 MED ORDER — SULFAMETHOXAZOLE-TRIMETHOPRIM 800-160 MG PO TABS: 1.0000 | ORAL_TABLET | Freq: Two times a day (BID) | ORAL | 0 refills | Status: DC

## 2016-08-04 NOTE — ED Provider Notes (Signed)
[facility]    CSN: [id number] Arrival date & time: 08/04/16  1720     History   Chief Complaint Chief Complaint  Patient presents with  . Polyuria  . Back Pain    HPI Nichole Boyd is a 31 y.o. female.   The history is provided by the patient.  Back Pain  Associated symptoms: dysuria   Associated symptoms: no fever   Dysuria  Pain quality:  Burning Pain severity:  Mild Onset quality:  Sudden Duration:  2 weeks Timing:  Constant Progression:  Worsening Chronicity:  New Recent urinary tract infections: no   Relieved by:  Nothing Ineffective treatments:  Phenazopyridine Urinary symptoms: discolored urine, foul-smelling urine and frequent urination   Associated symptoms: flank pain   Associated symptoms: no fever, no genital lesions, no nausea and no vaginal discharge   Risk factors: no hx of pyelonephritis, no hx of urolithiasis, no kidney transplant, not pregnant and no recurrent urinary tract infections     Past Medical History:  Diagnosis Date  . Endometriosis   . Kidney infection     There are no active problems to display for this patient.   Past Surgical History:  Procedure Laterality Date  . HYSTEROSCOPY    . LAPAROSCOPY      OB History    No data available       Home Medications    Prior to Admission medications   Medication Sig Start Date End Date Taking? Authorizing Provider  sulfamethoxazole-trimethoprim (BACTRIM DS,SEPTRA DS) 800-160 MG tablet Take 1 tablet by mouth 2 (two) times daily. 08/04/16   Payton Mccallumonty, Ellis Koffler, MD    Family History Family History  Problem Relation Age of Onset  . Diabetes Mother   . Heart attack Father     Social History Social History  Substance Use Topics  . Smoking status: Current Every Day Smoker  . Smokeless tobacco: Current User  . Alcohol use Yes     Comment: occ     Allergies   Amoxicillin   Review of Systems Review of Systems  Constitutional: Negative for fever.    Gastrointestinal: Negative for nausea.  Genitourinary: Positive for dysuria and flank pain. Negative for vaginal discharge.  Musculoskeletal: Positive for back pain.     Physical Exam Triage Vital Signs ED Triage Vitals  Enc Vitals Group     BP 08/04/16 1734 111/69     Pulse Rate 08/04/16 1734 78     Resp 08/04/16 1734 16     Temp 08/04/16 1734 99.2 F (37.3 C)     Temp Source 08/04/16 1734 Oral     SpO2 08/04/16 1734 100 %     Weight 08/04/16 1737 150 lb (68 kg)     Height 08/04/16 1737 5\' 3"  (1.6 m)     Head Circumference --      Peak Flow --      Pain Score 08/04/16 1737 2     Pain Loc --      Pain Edu? --      Excl. in GC? --    No data found.   Updated Vital Signs BP 111/69 (BP Location: Left Arm)   Pulse 78   Temp 99.2 F (37.3 C) (Oral)   Resp 16   Ht 5\' 3"  (1.6 m)   Wt 150 lb (68 kg)   LMP 07/08/2016 (Exact Date)   SpO2 100%   BMI 26.57 kg/m   Visual Acuity Right Eye Distance:   Left Eye Distance:  Bilateral Distance:    Right Eye Near:   Left Eye Near:    Bilateral Near:     Physical Exam  Constitutional: She appears well-developed and well-nourished. No distress.  Abdominal: Soft. Bowel sounds are normal. She exhibits no distension and no mass. There is tenderness (mild suprapubic and bilateral flank). There is no rebound and no guarding.  Skin: She is not diaphoretic.  Nursing note and vitals reviewed.    UC Treatments / Results  Labs (all labs ordered are listed, but only abnormal results are displayed) Labs Reviewed  URINALYSIS, COMPLETE (UACMP) WITH MICROSCOPIC - Abnormal; Notable for the following:       Result Value   APPearance HAZY (*)    Hgb urine dipstick MODERATE (*)    Protein, ur 30 (*)    Leukocytes, UA SMALL (*)    Squamous Epithelial / LPF 0-5 (*)    Bacteria, UA FEW (*)    All other components within normal limits  URINE CULTURE  PREGNANCY, URINE    EKG  EKG Interpretation None       Radiology No results  found.  Procedures Procedures (including critical care time)  Medications Ordered in UC Medications - No data to display   Initial Impression / Assessment and Plan / UC Course  I have reviewed the triage vital signs and the nursing notes.  Pertinent labs & imaging results that were available during my care of the patient were reviewed by me and considered in my medical decision making (see chart for details).       Final Clinical Impressions(s) / UC Diagnoses   Final diagnoses:  Acute cystitis with hematuria    New Prescriptions Discharge Medication List as of 08/04/2016  6:14 PM    START taking these medications   Details  sulfamethoxazole-trimethoprim (BACTRIM DS,SEPTRA DS) 800-160 MG tablet Take 1 tablet by mouth 2 (two) times daily., Starting Fri 08/04/2016, Normal       1. Lab results and diagnosis reviewed with patient 2. rx as per orders above; reviewed possible side effects, interactions, risks and benefits  3. Recommend supportive treatment with increased fluids, otc analgesics prn 4. Follow-up prn if symptoms worsen or don't improve   Payton Mccallumonty, Jaydrien Wassenaar, MD 08/04/16 1910

## 2016-08-04 NOTE — ED Triage Notes (Signed)
31 year old Caucasian female is here today with complaints of polyuria, dysuria - pressure after urinating, low back pain for the past two weeks. She states she does have history of kidney infections not UTIs. She states she has been taking OTC AZO with no relief.

## 2016-08-07 LAB — URINE CULTURE
Culture: >=100000 — AB
Special Requests: NORMAL

## 2016-08-13 ENCOUNTER — Telehealth: Payer: Self-pay

## 2016-08-13 NOTE — Telephone Encounter (Signed)
Pt calls in asking about test results and spoke to ColeNisha, CMA. Pt is feeling better and verbalized understanding of instructions to finish ABX and f/u as needed

## 2017-03-20 ENCOUNTER — Other Ambulatory Visit: Payer: Self-pay | Admitting: Ophthalmology

## 2017-03-20 DIAGNOSIS — H5347 Heteronymous bilateral field defects: Secondary | ICD-10-CM

## 2017-03-27 ENCOUNTER — Ambulatory Visit
Admission: RE | Admit: 2017-03-27 | Discharge: 2017-03-27 | Disposition: A | Discharge status: Home / Self Care | Payer: 59 | Source: Ambulatory Visit | Attending: Ophthalmology | Admitting: Ophthalmology

## 2017-04-27 ENCOUNTER — Emergency Department
Admission: EM | Admit: 2017-04-27 | Discharge: 2017-04-27 | Discharge status: Against Medical Advice | Payer: 59 | Attending: Emergency Medicine | Admitting: Emergency Medicine

## 2017-04-27 ENCOUNTER — Other Ambulatory Visit: Payer: Self-pay

## 2017-04-27 ENCOUNTER — Encounter: Payer: Self-pay | Admitting: *Deleted

## 2017-04-27 DIAGNOSIS — Z5321 Procedure and treatment not carried out due to patient leaving prior to being seen by health care provider: Secondary | ICD-10-CM | POA: Diagnosis not present

## 2017-04-27 DIAGNOSIS — R42 Dizziness and giddiness: Secondary | ICD-10-CM | POA: Insufficient documentation

## 2017-04-27 LAB — URINALYSIS, COMPLETE (UACMP) WITH MICROSCOPIC
Bilirubin Urine: NEGATIVE
Glucose, UA: NEGATIVE mg/dL
Hgb urine dipstick: NEGATIVE
Ketones, ur: NEGATIVE mg/dL
Leukocytes, UA: NEGATIVE
Nitrite: NEGATIVE
Protein, ur: NEGATIVE mg/dL
RBC / HPF: NONE SEEN RBC/hpf (ref 0–5)
SPECIFIC GRAVITY, URINE: 1.006 (ref 1.005–1.030)
pH: 6 (ref 5.0–8.0)

## 2017-04-27 LAB — BASIC METABOLIC PANEL
ANION GAP: 7 (ref 5–15)
BUN: 18 mg/dL (ref 6–20)
CALCIUM: 9.4 mg/dL (ref 8.9–10.3)
CO2: 28 mmol/L (ref 22–32)
Chloride: 101 mmol/L (ref 101–111)
Creatinine, Ser: 0.69 mg/dL (ref 0.44–1.00)
GFR calc Af Amer: >60 mL/min (ref >60)
GFR calc non Af Amer: >60 mL/min (ref >60)
GLUCOSE: 104 mg/dL — AB (ref 65–99)
POTASSIUM: 3.7 mmol/L (ref 3.5–5.1)
Sodium: 136 mmol/L (ref 135–145)

## 2017-04-27 LAB — CBC
HEMATOCRIT: 37.7 % (ref 35.0–47.0)
Hemoglobin: 12.9 g/dL (ref 12.0–16.0)
MCH: 30.3 pg (ref 26.0–34.0)
MCHC: 34.1 g/dL (ref 32.0–36.0)
MCV: 88.8 fL (ref 80.0–100.0)
Platelets: 271 10*3/uL (ref 150–440)
RBC: 4.25 MIL/uL (ref 3.80–5.20)
RDW: 12.8 % (ref 11.5–14.5)
WBC: 7.3 10*3/uL (ref 3.6–11.0)

## 2017-04-27 LAB — POC URINE PREG, ED: PREG TEST UR: NEGATIVE

## 2017-04-27 NOTE — ED Notes (Signed)
Pt called to room without answer.  

## 2017-04-27 NOTE — ED Triage Notes (Signed)
Pt to ED reporting sudden onset of dizziness this morning at 3:00 after having rolled over in the bed Pt reports the dizziness has worsened throughout the day and has turned into a headache on the right side of head. + nausea not vomiting. + light sensitivity  With hx of lost peripheral vision without known cause.   Pt has a hx of vertigo and reports dramamine usually helps but today it has not improved symptoms.

## 2017-04-30 ENCOUNTER — Telehealth: Payer: Self-pay | Admitting: Emergency Medicine

## 2017-04-30 NOTE — Telephone Encounter (Signed)
Called patient due to lwot to inquire about condition and follow up plans. Left message.   

## 2018-02-02 DIAGNOSIS — J45909 Unspecified asthma, uncomplicated: Secondary | ICD-10-CM

## 2018-02-02 HISTORY — DX: Unspecified asthma, uncomplicated: J45.909

## 2018-02-17 ENCOUNTER — Other Ambulatory Visit: Payer: Self-pay

## 2018-02-17 ENCOUNTER — Encounter: Payer: Self-pay | Admitting: Emergency Medicine

## 2018-02-17 ENCOUNTER — Ambulatory Visit
Admission: EM | Admit: 2018-02-17 | Discharge: 2018-02-17 | Disposition: A | Discharge status: Home / Self Care | Payer: 59 | Attending: Emergency Medicine | Admitting: Emergency Medicine

## 2018-02-17 ENCOUNTER — Ambulatory Visit (INDEPENDENT_AMBULATORY_CARE_PROVIDER_SITE_OTHER): Payer: 59

## 2018-02-17 DIAGNOSIS — R0602 Shortness of breath: Secondary | ICD-10-CM

## 2018-02-17 DIAGNOSIS — R0981 Nasal congestion: Secondary | ICD-10-CM | POA: Diagnosis not present

## 2018-02-17 DIAGNOSIS — J209 Acute bronchitis, unspecified: Secondary | ICD-10-CM | POA: Diagnosis not present

## 2018-02-17 DIAGNOSIS — R05 Cough: Secondary | ICD-10-CM | POA: Diagnosis not present

## 2018-02-17 DIAGNOSIS — Z87891 Personal history of nicotine dependence: Secondary | ICD-10-CM | POA: Diagnosis not present

## 2018-02-17 MED ORDER — AZITHROMYCIN 250 MG PO TABS: 250.0000 mg | ORAL_TABLET | Freq: Every day | ORAL | 0 refills | Status: DC

## 2018-02-17 MED ORDER — HYDROCOD POLST-CPM POLST ER 10-8 MG/5ML PO SUER: 5.0000 mL | Freq: Two times a day (BID) | ORAL | 0 refills | Status: DC

## 2018-02-17 MED ORDER — PREDNISONE 20 MG PO TABS: ORAL_TABLET | ORAL | 0 refills | Status: DC

## 2018-02-17 MED ORDER — ALBUTEROL SULFATE HFA 108 (90 BASE) MCG/ACT IN AERS: 1.0000 | INHALATION_SPRAY | Freq: Four times a day (QID) | RESPIRATORY_TRACT | 0 refills | Status: DC | PRN

## 2018-02-17 MED ORDER — BENZONATATE 200 MG PO CAPS: ORAL_CAPSULE | ORAL | 0 refills | Status: DC

## 2018-02-17 NOTE — ED Triage Notes (Signed)
Patient c/o cough and chest congestion for 4 weeks.  Patient denies fevers.  

## 2018-02-17 NOTE — ED Provider Notes (Addendum)
[facility]    CSN: [id number] Arrival date & time: 02/17/18  1402     History   Chief Complaint Chief Complaint  Patient presents with  . Cough    HPI Nichole Boyd is a 33 y.o. female.   HPI  A 33 year old female presents with 4 weeks history of cough and chest congestion.  She has been trying Robitussin which has not been accomplishing the goal.  She denies any fevers.  She states that she does have sputum production and has noticed blood recently because of the excessive coughing that she is having.  Coughing so hard and so much that her ribs are hurting around the diaphragmatic area.  Taking ibuprofen for that without much success either.  Quit smoking on December 7.        Past Medical History:  Diagnosis Date  . Endometriosis   . Kidney infection     There are no active problems to display for this patient.   Past Surgical History:  Procedure Laterality Date  . HYSTEROSCOPY    . LAPAROSCOPY      OB History   No obstetric history on file.      Home Medications    Prior to Admission medications   Medication Sig Start Date End Date Taking? Authorizing Provider  Norgestimate-Ethinyl Estradiol Triphasic (TRI-LO-SPRINTEC) 0.18/0.215/0.25 MG-25 MCG tab Take 1 tablet by mouth daily.   Yes [provider]  sulfamethoxazole-trimethoprim (BACTRIM DS,SEPTRA DS) 800-160 MG tablet Take 1 tablet by mouth 2 (two) times daily. 08/04/16   Payton Mccallum, MD    Family History Family History  Problem Relation Age of Onset  . Diabetes Mother   . Heart attack Father     Social History Social History   Tobacco Use  . Smoking status: Former Smoker    Types: Cigarettes  . Smokeless tobacco: Current User  Substance Use Topics  . Alcohol use: Yes    Comment: occ  . Drug use: No     Allergies   Amoxicillin   Review of Systems Review of Systems  Constitutional: Positive for activity change. Negative for appetite change, chills,  fatigue and fever.  HENT: Positive for congestion.   Respiratory: Positive for cough and shortness of breath.   All other systems reviewed and are negative.    Physical Exam Triage Vital Signs ED Triage Vitals  Enc Vitals Group     BP 02/17/18 1436 122/76     Pulse Rate 02/17/18 1436 96     Resp 02/17/18 1436 14     Temp 02/17/18 1436 98.7 F (37.1 C)     Temp Source 02/17/18 1436 Oral     SpO2 02/17/18 1436 97 %     Weight 02/17/18 1434 155 lb (70.3 kg)     Height 02/17/18 1434 5\' 3"  (1.6 m)     Head Circumference --      Peak Flow --      Pain Score 02/17/18 1433 6     Pain Loc --      Pain Edu? --      Excl. in GC? --    No data found.  Updated Vital Signs BP 122/76 (BP Location: Left Arm)   Pulse 96   Temp 98.7 F (37.1 C) (Oral)   Resp 14   Ht 5\' 3"  (1.6 m)   Wt 155 lb (70.3 kg)   LMP 01/27/2018   SpO2 97%   BMI 27.46 kg/m   Visual Acuity Right Eye  Distance:   Left Eye Distance:   Bilateral Distance:    Right Eye Near:   Left Eye Near:    Bilateral Near:     Physical Exam Vitals signs and nursing note reviewed.  Constitutional:      General: She is not in acute distress.    Appearance: Normal appearance. She is normal weight. She is not ill-appearing, toxic-appearing or diaphoretic.  HENT:     Head: Normocephalic and atraumatic.     Right Ear: Tympanic membrane, ear canal and external ear normal.     Left Ear: Tympanic membrane, ear canal and external ear normal.     Nose: Nose normal.     Mouth/Throat:     Mouth: Mucous membranes are moist.     Pharynx: Oropharynx is clear.  Eyes:     General:        Right eye: No discharge.        Left eye: No discharge.     Conjunctiva/sclera: Conjunctivae normal.  Neck:     Musculoskeletal: Normal range of motion and neck supple.  Pulmonary:     Effort: Pulmonary effort is normal.     Breath sounds: Normal breath sounds.  Musculoskeletal: Normal range of motion.  Skin:    General: Skin is warm and  dry.  Neurological:     General: No focal deficit present.     Mental Status: She is alert and oriented to person, place, and time.  Psychiatric:        Mood and Affect: Mood normal.        Behavior: Behavior normal.        Thought Content: Thought content normal.        Judgment: Judgment normal.      UC Treatments / Results  Labs (all labs ordered are listed, but only abnormal results are displayed) Labs Reviewed - No data to display  EKG None  Radiology No results found.  Procedures Procedures (including critical care time)  Medications Ordered in UC Medications - No data to display  Initial Impression / Assessment and Plan / UC Course  I have reviewed the triage vital signs and the nursing notes.  Pertinent labs & imaging results that were available during my care of the patient were reviewed by me and considered in my medical decision making (see chart for details).    I reviewed the x-ray with the patient.  No active cardiopulmonary process present.  She has had the symptoms for over a month.  We will provide her with albuterol inhaler.  Put her on prednisone for 4days give her suppressants of Tessalon Perles and Tussionex.  Also provide her with a Z-Pak.  She is not improving she should follow-up with her primary care physician for further evaluation and treatment.   Final Clinical Impressions(s) / UC Diagnoses   Final diagnoses:  None   Discharge Instructions   None    ED Prescriptions    None     Controlled Substance Prescriptions [place]rolled Substance Registry consulted? Not Applicable   Lutricia Feil, PA-C 02/17/18 1727    Lutricia Feil, PA-C 02/17/18 1728

## 2018-05-09 ENCOUNTER — Emergency Department: Payer: 59

## 2018-05-09 ENCOUNTER — Other Ambulatory Visit: Payer: Self-pay

## 2018-05-09 ENCOUNTER — Emergency Department
Admission: EM | Admit: 2018-05-09 | Discharge: 2018-05-09 | Disposition: A | Discharge status: Home / Self Care | Payer: 59 | Attending: Emergency Medicine | Admitting: Emergency Medicine

## 2018-05-09 DIAGNOSIS — R0789 Other chest pain: Secondary | ICD-10-CM

## 2018-05-09 DIAGNOSIS — R079 Chest pain, unspecified: Secondary | ICD-10-CM | POA: Diagnosis present

## 2018-05-09 DIAGNOSIS — Z87891 Personal history of nicotine dependence: Secondary | ICD-10-CM | POA: Diagnosis not present

## 2018-05-09 DIAGNOSIS — R110 Nausea: Secondary | ICD-10-CM | POA: Insufficient documentation

## 2018-05-09 DIAGNOSIS — J069 Acute upper respiratory infection, unspecified: Secondary | ICD-10-CM | POA: Diagnosis not present

## 2018-05-09 DIAGNOSIS — Z20822 Contact with and (suspected) exposure to covid-19: Secondary | ICD-10-CM

## 2018-05-09 DIAGNOSIS — Z20828 Contact with and (suspected) exposure to other viral communicable diseases: Secondary | ICD-10-CM | POA: Diagnosis not present

## 2018-05-09 DIAGNOSIS — R6889 Other general symptoms and signs: Principal | ICD-10-CM

## 2018-05-09 LAB — CBC
HCT: 37.8 % (ref 36.0–46.0)
Hemoglobin: 12.7 g/dL (ref 12.0–15.0)
MCH: 29.4 pg (ref 26.0–34.0)
MCHC: 33.6 g/dL (ref 30.0–36.0)
MCV: 87.5 fL (ref 80.0–100.0)
Platelets: 291 10*3/uL (ref 150–400)
RBC: 4.32 MIL/uL (ref 3.87–5.11)
RDW: 13.1 % (ref 11.5–15.5)
WBC: 6.9 10*3/uL (ref 4.0–10.5)
nRBC: 0 % (ref 0.0–0.2)

## 2018-05-09 LAB — BASIC METABOLIC PANEL
Anion gap: 11 (ref 5–15)
BUN: 12 mg/dL (ref 6–20)
CO2: 25 mmol/L (ref 22–32)
Calcium: 9.2 mg/dL (ref 8.9–10.3)
Chloride: 103 mmol/L (ref 98–111)
Creatinine, Ser: 0.64 mg/dL (ref 0.44–1.00)
GFR calc Af Amer: >60 mL/min (ref >60)
GFR calc non Af Amer: >60 mL/min (ref >60)
Glucose, Bld: 123 mg/dL — ABNORMAL HIGH (ref 70–99)
Potassium: 3.6 mmol/L (ref 3.5–5.1)
Sodium: 139 mmol/L (ref 135–145)

## 2018-05-09 LAB — TROPONIN I: Troponin I: <0.03 ng/mL (ref <0.03)

## 2018-05-09 LAB — POCT PREGNANCY, URINE: Preg Test, Ur: NEGATIVE

## 2018-05-09 MED ORDER — ALBUTEROL SULFATE HFA 108 (90 BASE) MCG/ACT IN AERS: 2.0000 | INHALATION_SPRAY | Freq: Four times a day (QID) | RESPIRATORY_TRACT | 0 refills | Status: DC | PRN

## 2018-05-09 MED ORDER — SODIUM CHLORIDE 0.9% FLUSH: 3.0000 mL | Freq: Once | INTRAVENOUS | Status: DC

## 2018-05-09 NOTE — Discharge Instructions (Signed)
Your symptoms are concerning for COVID-19 infection. You should remain out of work until your results are reported. You will be given instructions on total quarantine time by the reporting nurse. Take OTC cough medicine as needed. Use the prescription inhaler and nausea medicine as directed. Quarantine at home and limit your exposure with family. Wear a mask at all times and wash your hands often. Follow-up with your provider or return as needed.

## 2018-05-09 NOTE — ED Notes (Signed)
AAOx3.  Skin warm and dry.  NAD 

## 2018-05-09 NOTE — ED Provider Notes (Signed)
[facility] Emergency Department Provider Note ____________________________________________  Time seen: 1549  I have reviewed the triage vital signs and the nursing notes.  HISTORY  Chief Complaint  Chest Pain  HPI Nichole Boyd is a 33 y.o. female presents to the ED with onset of chest tightness, nausea, headache, and sore throat since last evening.  She describes worsening of her symptoms today.  [facility], and shortness of breath.  She was referred here for further evaluation.  Patient denies any fevers, chills, or sweats.  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 She denies  sick contacts, or recent travel.  Past Medical History:  Diagnosis Date  . Endometriosis   . Kidney infection     There are no active problems to display for this patient.   Past Surgical History:  Procedure Laterality Date  . HYSTEROSCOPY    . LAPAROSCOPY      Prior to Admission medications   Medication Sig Start Date End Date Taking? Authorizing Provider  albuterol (VENTOLIN HFA) 108 (90 Base) MCG/ACT inhaler Inhale 2 puffs into the lungs every 6 (six) hours as needed. 05/09/18   Arriyana Rodell, Charlesetta Ivory, PA-C  Norgestimate-Ethinyl Estradiol Triphasic (TRI-LO-SPRINTEC) 0.18/0.215/0.25 MG-25 MCG tab Take 1 tablet by mouth daily.    [provider]    Allergies Amoxicillin  Family History  Problem Relation Age of Onset  . Diabetes Mother   . Heart attack Father     Social History Social History   Tobacco Use  . Smoking status: Former Smoker    Types: Cigarettes  . Smokeless tobacco: Current User  Substance Use Topics  . Alcohol use: Yes    Comment: occ  . Drug use: No    Review of Systems  Constitutional: Negative for fever. Eyes: Negative for visual changes. ENT: Negative for sore throat. Cardiovascular: Positivefor chest pain. Respiratory: Positive for  shortness of breath. Gastrointestinal: Negative for abdominal pain, vomiting and diarrhea. Reports nausea Genitourinary: Negative for dysuria. Musculoskeletal: Negative for back pain. Skin: Negative for rash. Neurological: Negative for headaches, focal weakness or numbness. ____________________________________________  PHYSICAL EXAM:  VITAL SIGNS: ED Triage Vitals  Enc Vitals Group     BP 05/09/18 1508 (!) 143/83     Pulse Rate 05/09/18 1508 91     Resp 05/09/18 1508 17     Temp 05/09/18 1508 98.7 F (37.1 C)     Temp Source 05/09/18 1508 Oral     SpO2 05/09/18 1508 99 %     Weight 05/09/18 1508 160 lb (72.6 kg)     Height 05/09/18 1508 5\' 3"  (1.6 m)     Head Circumference --      Peak Flow --      Pain Score 05/09/18 1512 0     Pain Loc --      Pain Edu? --      Excl. in GC? --     Constitutional: Alert and oriented. Well appearing and in no distress. Head: Normocephalic and atraumatic. Eyes: Conjunctivae are normal. Normal extraocular movements Ears: Canals clear. TMs intact bilaterally. Nose: No congestion/rhinorrhea/epistaxis. Mouth/Throat: Mucous membranes are moist. Cardiovascular: Normal rate, regular rhythm. Normal distal pulses. Respiratory: Normal respiratory effort. No wheezes/rales/rhonchi. Gastrointestinal: Soft and nontender. No distention. Musculoskeletal: Nontender with normal range of motion in all extremities.  Neurologic:  Normal gait without ataxia. Normal speech and language. No gross focal neurologic deficits  are appreciated. Skin:  Skin is warm, dry and intact. No rash noted. Psychiatric: Mood and affect are normal. Patient exhibits appropriate insight and judgment. ____________________________________________   LABS (pertinent positives/negatives) Labs Reviewed  BASIC METABOLIC PANEL - Abnormal; Notable for the following components:      Result Value   Glucose, Bld 123 (*)    All other components within normal limits  NOVEL CORONAVIRUS, NAA  (HOSPITAL ORDER, SEND-OUT TO REF LAB)  CBC  TROPONIN I  POC URINE PREG, ED  POCT PREGNANCY, URINE  ____________________________________________  EKG   ____________________________________________   RADIOLOGY  CXR  Negative  I, Mohannad Olivero V Bacon-Jumana Paccione, personally viewed and evaluated these images (plain radiographs) as part of my medical decision making, as well as reviewing the written report by the radiologist. ____________________________________________  PROCEDURES  Procedures ____________________________________________  INITIAL IMPRESSION / ASSESSMENT AND PLAN / ED COURSE  Nichole Boyd was evaluated in Emergency Department on 05/09/2018 for the symptoms described in the history of present illness. She was evaluated in the context of the global COVID-19 pandemic, which necessitated consideration that the patient might be at risk for infection with the SARS-CoV-2 virus that causes COVID-19. Institutional protocols and algorithms that pertain to the evaluation of patients at risk for COVID-19 are in a state of rapid change based on information released by regulatory bodies including the CDC and federal and state organizations. These policies and algorithms were followed during the patient's care in the ED.  Differential diagnosis includes, but is not limited to, ACS, aortic dissection, pulmonary embolism, cardiac tamponade, pneumothorax, pneumonia, pericarditis, myocarditis, GI-related causes including esophagitis/gastritis, and musculoskeletal chest wall pain.    Patient with ED evaluation of SOB, chest tightness, and nausea. Low risk concern for ACS, PE, CAP, GI etiologies. Her symptoms are concerning for COVID-19, due to her contact without appropriate mask and distancing. She will await her COVID send-out results. She has been advised to stay at home and await results. An albuterol inhaler has been sent to her pharmacy. Quarantine & return precautions have been reviewed.   ____________________________________________  FINAL CLINICAL IMPRESSION(S) / ED DIAGNOSES  Final diagnoses:  Suspected Covid-19 Virus Infection  Viral URI  Atypical chest pain      Beatriz Quintela, Charlesetta IvoryJenise V Bacon, PA-C 05/09/18 1708    Don PerkingVeronese, [place], MD 05/09/18 2231

## 2018-05-09 NOTE — ED Triage Notes (Signed)
Pt reports chest tightness since last night.  Sx worse today.  [facility]  Pt sent to er for eval.  Pt reports sob. Non smoker.  Pt reports s sore throat.  No cough.  Pt alert.   Speech clear.

## 2018-05-10 LAB — NOVEL CORONAVIRUS, NAA (HOSP ORDER, SEND-OUT TO REF LAB; TAT 18-24 HRS): SARS-CoV-2, NAA: NOT DETECTED

## 2018-09-23 ENCOUNTER — Other Ambulatory Visit: Payer: Self-pay

## 2018-09-23 ENCOUNTER — Ambulatory Visit (INDEPENDENT_AMBULATORY_CARE_PROVIDER_SITE_OTHER): Payer: 59 | Admitting: Nurse Practitioner

## 2018-09-23 ENCOUNTER — Encounter: Payer: Self-pay | Admitting: Nurse Practitioner

## 2018-09-23 VITALS — BP 120/74 | HR 92 | Temp 99.0°F | Resp 16 | Ht 63.0 in | Wt 176.0 lb

## 2018-09-23 DIAGNOSIS — R5383 Other fatigue: Secondary | ICD-10-CM

## 2018-09-23 DIAGNOSIS — B9689 Other specified bacterial agents as the cause of diseases classified elsewhere: Secondary | ICD-10-CM

## 2018-09-23 DIAGNOSIS — Z30011 Encounter for initial prescription of contraceptive pills: Secondary | ICD-10-CM

## 2018-09-23 DIAGNOSIS — N760 Acute vaginitis: Secondary | ICD-10-CM

## 2018-09-23 DIAGNOSIS — F411 Generalized anxiety disorder: Secondary | ICD-10-CM

## 2018-09-23 MED ORDER — HYDROXYZINE HCL 50 MG PO TABS: 50.0000 mg | ORAL_TABLET | Freq: Three times a day (TID) | ORAL | 2 refills | Status: DC | PRN

## 2018-09-23 MED ORDER — METRONIDAZOLE 500 MG PO TABS: 500.0000 mg | ORAL_TABLET | Freq: Two times a day (BID) | ORAL | 0 refills | Status: DC

## 2018-09-23 MED ORDER — DROSPIRENONE-ETHINYL ESTRADIOL 3-0.02 MG PO TABS: 1.0000 | ORAL_TABLET | Freq: Every day | ORAL | 11 refills | Status: DC

## 2018-09-23 NOTE — Progress Notes (Signed)
[facility] [address] [place], [place] [address]  Internal MEDICINE  Office Visit Note  Patient Name: Nichole Boyd  1987  [id number]  Date of Service: 10/06/2018   Complaints/HPI Pt is here for establishment of PCP. Chief Complaint  Patient presents with  . Establish Care  . Hypertension    [place] b[place]line hypertensive  . Medication Management    would like to change birth control; caused mood swings and breasts are very sore.   . Vaginitis   The patient is here to establish new primary care provider. She had not been to her previous PCP in so long, she was dropped as a patient and they are no longer taking new patients. Today, she would really like to change her birth control. She states that recently, she is having problems with her birth control. Periods on this pill, her periods are very light and has no cramping. She states that she is having terrible mood swings. This month, she is having very tender breasts and lower belly pain. She states that breast tenderness was so severe, she has been unable to wear a bra the past two days. She states that she has also noted some vaginal discharge with odor. She frequently gets bacterial vaginitis. Has taken flagyl in the past which has been effective. Her [facility] pap smear [facility] in late, 2019. Results were normal.    Current Medication: Outpatient Encounter Medications as of 09/23/2018  Medication Sig  . albuterol (VENTOLIN HFA) 108 (90 Base) MCG/ACT inhaler Inhale 2 puffs into the lungs every 6 (six) hours as needed.  . hydrOXYzine (ATARAX/VISTARIL) 50 MG tablet Take 1 tablet (50 mg total) by mouth 3 (three) times daily as needed for anxiety.  . Norgestimate-Ethinyl Estradiol Triphasic (TRI-LO-SPRINTEC) 0.18/0.215/0.25 MG-25 MCG tab Take 1 tablet by mouth daily.  . [DISCONTINUED] hydrOXYzine (ATARAX/VISTARIL) 50 MG tablet Take 50 mg by mouth as  needed for anxiety.  . drospirenone-ethinyl estradiol (YAZ) 3-0.02 MG tablet Take 1 tablet by mouth daily.  . metroNIDAZOLE (FLAGYL) 500 MG tablet Take 1 tablet (500 mg total) by mouth 2 (two) times daily.   No facility-administered encounter medications on file as of 09/23/2018.     Surgical History: Past Surgical History:  Procedure Laterality Date  . HYSTEROSCOPY    . LAPAROSCOPY      Medical History: Past Medical History:  Diagnosis Date  . Allergies   . Asthma 02/02/2018  . Endometriosis   . Kidney infection     Family History: Family History  Problem Relation Age of Onset  . Diabetes Mother   . Heart attack Father     Social History   Socioeconomic History  . Marital status: Single    Spouse name: Not on file  . Number of children: Not on file  . Years of education: Not on file  . Highest education level: Not on file  Occupational History  . Not on file  Social Needs  . Financial resource strain: Not on file  . Food insecurity    Worry: Not on file    Inability: Not on file  . Transportation needs    Medical: Not on file    Non-medical: Not on file  Tobacco Use  . Smoking status: Former Smoker    Types: Cigarettes  . Smokeless tobacco: Never Used  Substance and Sexual Activity  . Alcohol use: Not Currently  . Drug use: No  .  Sexual activity: Yes    Birth control/protection: Condom  Lifestyle  . Physical activity    Days per week: Not on file    Minutes per session: Not on file  . Stress: Not on file  Relationships  . Social Musician on phone: Not on file    Gets together: Not on file    Attends religious service: Not on file    Active member of club or organization: Not on file    Attends meetings of clubs or organizations: Not on file    Relationship status: Not on file  . Intimate partner violence    Fear of current or ex partner: Not on file    Emotionally abused: Not on file    Physically abused: Not on file    Forced  sexual activity: Not on file  Other Topics Concern  . Not on file  Social History Narrative  . Not on file     Review of Systems  Constitutional: Negative for chills, fatigue and unexpected weight change.  HENT: Negative for congestion, postnasal drip, rhinorrhea, sneezing and sore throat.   Respiratory: Negative for cough, chest tightness, shortness of breath and wheezing.   Cardiovascular: Negative for chest pain and palpitations.  Gastrointestinal: Negative for abdominal pain, constipation, diarrhea, nausea and vomiting.  Genitourinary: Positive for menstrual problem. Negative for dysuria and frequency.       Vaginal discharge with odor. Has history of intermittent bacterial vaginitis.   Musculoskeletal: Negative for arthralgias, back pain, joint swelling and neck pain.  Skin: Negative for rash.  Neurological: Negative for dizziness, tremors, numbness and headaches.  Hematological: Negative for adenopathy. Does not bruise/bleed easily.  Psychiatric/Behavioral: Negative for behavioral problems (Depression), sleep disturbance and suicidal ideas. The patient is not nervous/anxious.    Today's Vitals   09/23/18 1530  BP: 120/74  Pulse: 92  Resp: 16  Temp: 99 F (37.2 C)  SpO2: 99%  Weight: 176 lb (79.8 kg)  Height: 5\' 3"  (1.6 m)   Body mass index is 31.18 kg/m.  Physical Exam Constitutional:      General: She is not in acute distress.    Appearance: Normal appearance. She is well-developed. She is not diaphoretic.  HENT:     Head: Normocephalic and atraumatic.     Mouth/Throat:     Pharynx: No oropharyngeal exudate.  Eyes:     Pupils: Pupils are equal, round, and reactive to light.  Neck:     Musculoskeletal: Normal range of motion and neck supple.     Thyroid: No thyromegaly.     Vascular: No JVD.     Trachea: No tracheal deviation.  Cardiovascular:     Rate and Rhythm: Normal rate and regular rhythm.     Heart sounds: Normal heart sounds. No murmur. No friction  rub. No gallop.   Pulmonary:     Effort: Pulmonary effort is normal. No respiratory distress.     Breath sounds: Normal breath sounds. No wheezing or rales.  Chest:     Chest wall: No tenderness.  Abdominal:     General: Bowel sounds are normal.     Palpations: Abdomen is soft.     Tenderness: There is no abdominal tenderness.  Musculoskeletal: Normal range of motion.  Lymphadenopathy:     Cervical: No cervical adenopathy.  Skin:    General: Skin is warm and dry.  Neurological:     Mental Status: She is alert and oriented to person, place, and time.  Cranial Nerves: No cranial nerve deficit.  Psychiatric:        Behavior: Behavior normal.        Thought Content: Thought content normal.        Judgment: Judgment normal.   Assessment/Plan: 1. Bacterial vaginitis Start flagyl twice daily for 7 days.  - metroNIDAZOLE (FLAGYL) 500 MG tablet; Take 1 tablet (500 mg total) by mouth 2 (two) times daily.  Dispense: 14 tablet; Refill: 0  2. Other fatigue Will check routine, fasting labs, including thyroid panel.  3. Generalized anxiety disorder Continue to take hydroxyzine 50mg  up to three times daily as needed for acute anxiety.  - hydrOXYzine (ATARAX/VISTARIL) 50 MG tablet; Take 1 tablet (50 mg total) by mouth 3 (three) times daily as needed for anxiety.  Dispense: 90 tablet; Refill: 2  4. Encounter for prescription of oral contraceptives New prescription for yaz. Take as directed.  - drospirenone-ethinyl estradiol (YAZ) 3-0.02 MG tablet; Take 1 tablet by mouth daily.  Dispense: 1 Package; Refill: 11  General Counseling: Autum verbalizes understanding of the findings of todays visit and agrees with plan of treatment. I have discussed any further diagnostic evaluation that may be needed or ordered today. We also reviewed her medications today. she has been encouraged to call the office with any questions or concerns that should arise related to todays visit.    Counseling:  This  patient was seen by [place] with Dr Lavera Guise as a part of collaborative care agreement  Meds ordered this encounter  Medications  . drospirenone-ethinyl estradiol (YAZ) 3-0.02 MG tablet    Sig: Take 1 tablet by mouth daily.    Dispense:  1 Package    Refill:  11    Order Specific Question:   Supervising Provider    Answer:   Lavera Guise [place]  . metroNIDAZOLE (FLAGYL) 500 MG tablet    Sig: Take 1 tablet (500 mg total) by mouth 2 (two) times daily.    Dispense:  14 tablet    Refill:  0    Order Specific Question:   Supervising Provider    Answer:   Lavera Guise [[id number]]  . hydrOXYzine (ATARAX/VISTARIL) 50 MG tablet    Sig: Take 1 tablet (50 mg total) by mouth 3 (three) times daily as needed for anxiety.    Dispense:  90 tablet    Refill:  2    Order Specific Question:   Supervising Provider    Answer:   Lavera Guise [[id number]]    Time spent: 30 Minutes

## 2018-09-25 ENCOUNTER — Encounter: Payer: Self-pay | Admitting: Nurse Practitioner

## 2018-10-06 DIAGNOSIS — B9689 Other specified bacterial agents as the cause of diseases classified elsewhere: Secondary | ICD-10-CM | POA: Insufficient documentation

## 2018-10-06 DIAGNOSIS — N760 Acute vaginitis: Secondary | ICD-10-CM | POA: Insufficient documentation

## 2018-10-06 DIAGNOSIS — Z30011 Encounter for initial prescription of contraceptive pills: Secondary | ICD-10-CM | POA: Insufficient documentation

## 2018-10-06 DIAGNOSIS — R5383 Other fatigue: Secondary | ICD-10-CM | POA: Insufficient documentation

## 2018-10-06 DIAGNOSIS — F411 Generalized anxiety disorder: Secondary | ICD-10-CM | POA: Insufficient documentation

## 2018-12-09 ENCOUNTER — Ambulatory Visit
Admission: EM | Admit: 2018-12-09 | Discharge: 2018-12-09 | Disposition: A | Discharge status: Home / Self Care | Payer: 59 | Attending: Family Medicine | Admitting: Family Medicine

## 2018-12-09 ENCOUNTER — Other Ambulatory Visit: Payer: Self-pay

## 2018-12-09 DIAGNOSIS — Z87891 Personal history of nicotine dependence: Secondary | ICD-10-CM | POA: Diagnosis not present

## 2018-12-09 DIAGNOSIS — J069 Acute upper respiratory infection, unspecified: Secondary | ICD-10-CM | POA: Diagnosis not present

## 2018-12-09 NOTE — ED Triage Notes (Addendum)
Pt with cough which makes her chest hurt, chest feels tight, nasal congestion, and headache. No fever.

## 2018-12-09 NOTE — Discharge Instructions (Signed)
Rest, fluids, over the counter medications °

## 2018-12-09 NOTE — ED Provider Notes (Signed)
[facility]    CSN: [id number] Arrival date & time: 12/09/18  1004      History   Chief Complaint Chief Complaint  Patient presents with  . Cough  . Nasal Congestion  . Headache    HPI Nichole Boyd is a 33 y.o. female.   33 yo female with a c/o cough, nasal congestion, headache. Denies any fevers, chills. No known sick contacts.    Cough Associated symptoms: headaches   Headache Associated symptoms: cough     Past Medical History:  Diagnosis Date  . Allergies   . Asthma 02/02/2018  . Endometriosis   . Kidney infection     Patient Active Problem List   Diagnosis Date Noted  . Bacterial vaginitis 10/06/2018  . Other fatigue 10/06/2018  . Generalized anxiety disorder 10/06/2018  . Encounter for prescription of oral contraceptives 10/06/2018    Past Surgical History:  Procedure Laterality Date  . HYSTEROSCOPY    . LAPAROSCOPY      OB History   No obstetric history on file.      Home Medications    Prior to Admission medications   Medication Sig Start Date End Date Taking? Authorizing Provider  albuterol (VENTOLIN HFA) 108 (90 Base) MCG/ACT inhaler Inhale 2 puffs into the lungs every 6 (six) hours as needed. 05/09/18   Menshew, Charlesetta Ivory, PA-C  drospirenone-ethinyl estradiol (YAZ) 3-0.02 MG tablet Take 1 tablet by mouth daily. 09/23/18   Carlean Jews, NP  hydrOXYzine (ATARAX/VISTARIL) 50 MG tablet Take 1 tablet (50 mg total) by mouth 3 (three) times daily as needed for anxiety. 09/23/18   Carlean Jews, NP  metroNIDAZOLE (FLAGYL) 500 MG tablet Take 1 tablet (500 mg total) by mouth 2 (two) times daily. 09/23/18   Carlean Jews, NP  Norgestimate-Ethinyl Estradiol Triphasic (TRI-LO-SPRINTEC) 0.18/0.215/0.25 MG-25 MCG tab Take 1 tablet by mouth daily.    [provider]    Family History Family History  Problem Relation Age of Onset  . Diabetes Mother   . Heart attack Father     Social History Social History    Tobacco Use  . Smoking status: Former Smoker    Types: Cigarettes  . Smokeless tobacco: Never Used  Substance Use Topics  . Alcohol use: Not Currently  . Drug use: No     Allergies   Amoxicillin   Review of Systems Review of Systems  Respiratory: Positive for cough.   Neurological: Positive for headaches.     Physical Exam Triage Vital Signs ED Triage Vitals  Enc Vitals Group     BP 12/09/18 1022 126/87     Pulse Rate 12/09/18 1022 100     Resp 12/09/18 1022 19     Temp 12/09/18 1022 98.4 F (36.9 C)     Temp Source 12/09/18 1022 Oral     SpO2 12/09/18 1022 99 %     Weight 12/09/18 1024 170 lb (77.1 kg)     Height 12/09/18 1024 5\' 3"  (1.6 m)     Head Circumference --      Peak Flow --      Pain Score 12/09/18 1021 5     Pain Loc --      Pain Edu? --      Excl. in GC? --    No data found.  Updated Vital Signs BP 126/87 (BP Location: Left Arm)   Pulse 100   Temp 98.4 F (36.9 C) (Oral)   Resp 19  Ht 5\' 3"  (1.6 m)   Wt 77.1 kg   LMP 10/09/2018   SpO2 99%   BMI 30.11 kg/m   Visual Acuity Right Eye Distance:   Left Eye Distance:   Bilateral Distance:    Right Eye Near:   Left Eye Near:    Bilateral Near:     Physical Exam Vitals signs and nursing note reviewed.  Constitutional:      General: She is not in acute distress.    Appearance: She is not toxic-appearing or diaphoretic.  Pulmonary:     Effort: Pulmonary effort is normal. No respiratory distress.  Neurological:     Mental Status: She is alert.      UC Treatments / Results  Labs (all labs ordered are listed, but only abnormal results are displayed) Labs Reviewed  NOVEL CORONAVIRUS, NAA (HOSP ORDER, SEND-OUT TO REF LAB; TAT 18-24 HRS)    EKG   Radiology No results found.  Procedures Procedures (including critical care time)  Medications Ordered in UC Medications - No data to display  Initial Impression / Assessment and Plan / UC Course  I have reviewed the triage  vital signs and the nursing notes.  Pertinent labs & imaging results that were available during my care of the patient were reviewed by me and considered in my medical decision making (see chart for details).      Final Clinical Impressions(s) / UC Diagnoses   Final diagnoses:  Viral URI with cough     Discharge Instructions     Rest, fluids, over the counter medications    ED Prescriptions    None      1. diagnosis reviewed with patient 2. rx as per orders above; reviewed possible side effects, interactions, risks and benefits  3. Recommend supportive treatment as above  4. Follow-up prn if symptoms worsen or don't improve  PDMP not reviewed this encounter.   Norval Gable, MD 12/09/18 1300

## 2018-12-10 LAB — NOVEL CORONAVIRUS, NAA (HOSP ORDER, SEND-OUT TO REF LAB; TAT 18-24 HRS): SARS-CoV-2, NAA: NOT DETECTED

## 2018-12-20 ENCOUNTER — Telehealth: Payer: Self-pay

## 2018-12-20 NOTE — Telephone Encounter (Signed)
LMOM FOR PATIENT TO CONFIRM AND SCREEN FOR 12-24-18 OV.

## 2018-12-24 ENCOUNTER — Encounter: Payer: Self-pay | Admitting: Nurse Practitioner

## 2018-12-24 ENCOUNTER — Ambulatory Visit: Payer: 59 | Admitting: Nurse Practitioner

## 2018-12-24 ENCOUNTER — Other Ambulatory Visit: Payer: Self-pay

## 2018-12-24 VITALS — BP 114/76 | HR 96 | Temp 97.6°F | Resp 16 | Ht 63.0 in | Wt 177.0 lb

## 2018-12-24 DIAGNOSIS — N912 Amenorrhea, unspecified: Secondary | ICD-10-CM

## 2018-12-24 DIAGNOSIS — N760 Acute vaginitis: Secondary | ICD-10-CM

## 2018-12-24 DIAGNOSIS — B9689 Other specified bacterial agents as the cause of diseases classified elsewhere: Secondary | ICD-10-CM | POA: Diagnosis not present

## 2018-12-24 DIAGNOSIS — Z3200 Encounter for pregnancy test, result unknown: Secondary | ICD-10-CM

## 2018-12-24 DIAGNOSIS — Z793 Long term (current) use of hormonal contraceptives: Secondary | ICD-10-CM

## 2018-12-24 LAB — POCT URINE PREGNANCY: Preg Test, Ur: NEGATIVE

## 2018-12-24 MED ORDER — METRONIDAZOLE 500 MG PO TABS: 500.0000 mg | ORAL_TABLET | Freq: Two times a day (BID) | ORAL | 1 refills | Status: DC

## 2018-12-24 NOTE — Progress Notes (Signed)
[facility] [address] [place], [place] [address]  Internal MEDICINE  Office Visit Note  Patient Name: Nichole Boyd  [id number]  [id number]  Date of Service: 01/01/2019  Chief Complaint  Patient presents with  . Medical Management of Chronic Issues    3 month follow up , has not had a period in 3 months     The patient is here for follow up visit. Her birth control was changed to Yaz at her initial visit. She had been having severe mood swings and irritability on her first OCP. She states that moods have improved greatly since changing her birth control. She states that she has not had a menstrual cycle since the change was made. She still gets tender breasts and pelvic cramping every month like she should have a period, but will only spot for a day. She is not pregnant.  She reports presence of vaginal discharge with odor.Seems to occur after she has intercourse. She gets this every three to six months. It is effectively treated with oral flagyl.       Current Medication: Outpatient Encounter Medications as of 12/24/2018  Medication Sig  . hydrOXYzine (ATARAX/VISTARIL) 50 MG tablet Take 1 tablet (50 mg total) by mouth 3 (three) times daily as needed for anxiety.  Marland Kitchen albuterol (VENTOLIN HFA) 108 (90 Base) MCG/ACT inhaler Inhale 2 puffs into the lungs every 6 (six) hours as needed.  . drospirenone-ethinyl estradiol (YAZ) 3-0.02 MG tablet Take 1 tablet by mouth daily.  . metroNIDAZOLE (FLAGYL) 500 MG tablet Take 1 tablet (500 mg total) by mouth 2 (two) times daily.  . [DISCONTINUED] metroNIDAZOLE (FLAGYL) 500 MG tablet Take 1 tablet (500 mg total) by mouth 2 (two) times daily. (Patient not taking: Reported on 12/24/2018)  . [DISCONTINUED] Norgestimate-Ethinyl Estradiol Triphasic (TRI-LO-SPRINTEC) 0.18/0.215/0.25 MG-25 MCG tab Take 1 tablet by mouth daily.   No facility-administered encounter medications on file as of 12/24/2018.    Surgical History: Past Surgical  History:  Procedure Laterality Date  . HYSTEROSCOPY    . LAPAROSCOPY      Medical History: Past Medical History:  Diagnosis Date  . Allergies   . Asthma 02/02/2018  . Endometriosis   . Kidney infection     Family History: Family History  Problem Relation Age of Onset  . Diabetes Mother   . Heart attack Father     Social History   Socioeconomic History  . Marital status: Single    Spouse name: Not on file  . Number of children: Not on file  . Years of education: Not on file  . Highest education level: Not on file  Occupational History  . Not on file  Tobacco Use  . Smoking status: Former Smoker    Types: Cigarettes  . Smokeless tobacco: Never Used  Substance and Sexual Activity  . Alcohol use: Not Currently  . Drug use: No  . Sexual activity: Yes    Birth control/protection: Condom  Other Topics Concern  . Not on file  Social History Narrative  . Not on file   Social Determinants of Health   Financial Resource Strain:   . Difficulty of Paying Living Expenses: Not on file  Food Insecurity:   . Worried About Programme researcher, broadcasting/film/video in the Last Year: Not on file  . Ran Out of Food in the Last Year: Not on file  Transportation Needs:   . Lack of Transportation (Medical): Not on file  . Lack of Transportation (Non-Medical): Not on  file  Physical Activity:   . Days of Exercise per Week: Not on file  . Minutes of Exercise per Session: Not on file  Stress:   . Feeling of Stress : Not on file  Social Connections:   . Frequency of Communication with Friends and Family: Not on file  . Frequency of Social Gatherings with Friends and Family: Not on file  . Attends Religious Services: Not on file  . Active Member of Clubs or Organizations: Not on file  . Attends Archivist Meetings: Not on file  . Marital Status: Not on file  Intimate Partner Violence:   . Fear of Current or Ex-Partner: Not on file  . Emotionally Abused: Not on file  . Physically Abused:  Not on file  . Sexually Abused: Not on file      Review of Systems  Constitutional: Negative for chills, fatigue and unexpected weight change.  HENT: Negative for congestion, postnasal drip, rhinorrhea, sneezing and sore throat.   Respiratory: Negative for cough, chest tightness, shortness of breath and wheezing.   Cardiovascular: Negative for chest pain and palpitations.  Gastrointestinal: Negative for abdominal pain, constipation, diarrhea, nausea and vomiting.  Genitourinary: Positive for menstrual problem. Negative for dysuria and frequency.       Vaginal discharge with odor. Has history of intermittent bacterial vaginitis. The patient states she has not had menstrual period since starting on new prescription of birth control   Musculoskeletal: Negative for arthralgias, back pain, joint swelling and neck pain.  Skin: Negative for rash.  Neurological: Negative for dizziness, tremors, numbness and headaches.  Hematological: Negative for adenopathy. Does not bruise/bleed easily.  Psychiatric/Behavioral: Negative for behavioral problems (Depression), sleep disturbance and suicidal ideas. The patient is not nervous/anxious.     Today's Vitals   12/24/18 1511  BP: 114/76  Pulse: 96  Resp: 16  Temp: 97.6 F (36.4 C)  SpO2: 98%  Weight: 177 lb (80.3 kg)  Height: 5\' 3"  (1.6 m)   Body mass index is 31.35 kg/m.  Physical Exam Vitals and nursing note reviewed.  Constitutional:      General: She is not in acute distress.    Appearance: Normal appearance. She is well-developed. She is not diaphoretic.  HENT:     Head: Normocephalic and atraumatic.     Mouth/Throat:     Pharynx: No oropharyngeal exudate.  Eyes:     Pupils: Pupils are equal, round, and reactive to light.  Neck:     Thyroid: No thyromegaly.     Vascular: No JVD.     Trachea: No tracheal deviation.  Cardiovascular:     Rate and Rhythm: Normal rate and regular rhythm.     Heart sounds: Normal heart sounds. No  murmur. No friction rub. No gallop.   Pulmonary:     Effort: Pulmonary effort is normal. No respiratory distress.     Breath sounds: Normal breath sounds. No wheezing or rales.  Chest:     Chest wall: No tenderness.  Abdominal:     General: Bowel sounds are normal.     Palpations: Abdomen is soft.     Tenderness: There is no abdominal tenderness.  Musculoskeletal:        General: Normal range of motion.     Cervical back: Normal range of motion and neck supple.  Lymphadenopathy:     Cervical: No cervical adenopathy.  Skin:    General: Skin is warm and dry.  Neurological:     Mental Status: She is  alert and oriented to person, place, and time.     Cranial Nerves: No cranial nerve deficit.  Psychiatric:        Behavior: Behavior normal.        Thought Content: Thought content normal.        Judgment: Judgment normal.    Assessment/Plan:  1. Bacterial vaginitis Metronidazole 500mg  twice daily for 7 days. May repeat one time as needed  - metroNIDAZOLE (FLAGYL) 500 MG tablet; Take 1 tablet (500 mg total) by mouth 2 (two) times daily.  Dispense: 14 tablet; Refill: 1  2. Possible pregnancy, not confirmed Negative pregancy test.  - POCT urine pregnancy  3. Amenorrhea due to oral contraceptive Likely from new birth control. Will continue to monitor. Negative pregnancy test today.   General Counseling: Dula verbalizes understanding of the findings of todays visit and agrees with plan of treatment. I have discussed any further diagnostic evaluation that may be needed or ordered today. We also reviewed her medications today. she has been encouraged to call the office with any questions or concerns that should arise related to todays visit.  This patient was seen by Vincent GrosHeather Lamiah Marmol FNP Collaboration with Dr Lyndon CodeFozia M Khan as a part of collaborative care agreement  Orders Placed This Encounter  Procedures  . POCT urine pregnancy    Meds ordered this encounter  Medications  .  metroNIDAZOLE (FLAGYL) 500 MG tablet    Sig: Take 1 tablet (500 mg total) by mouth 2 (two) times daily.    Dispense:  14 tablet    Refill:  1    Order Specific Question:   Supervising Provider    Answer:   Lyndon CodeKHAN, FOZIA M [[id number]]    Time spent: 5215 Minutes      Dr Lyndon CodeFozia M Khan Internal medicine

## 2019-01-01 DIAGNOSIS — N912 Amenorrhea, unspecified: Secondary | ICD-10-CM | POA: Insufficient documentation

## 2019-01-01 DIAGNOSIS — Z3200 Encounter for pregnancy test, result unknown: Secondary | ICD-10-CM | POA: Insufficient documentation

## 2019-02-04 ENCOUNTER — Other Ambulatory Visit: Payer: Self-pay

## 2019-02-04 ENCOUNTER — Ambulatory Visit: Payer: 59 | Admitting: Internal Medicine

## 2019-02-04 ENCOUNTER — Encounter: Payer: Self-pay | Admitting: Internal Medicine

## 2019-02-04 VITALS — BP 124/73 | HR 87 | Temp 97.7°F | Resp 16 | Ht 63.0 in | Wt 170.0 lb

## 2019-02-04 DIAGNOSIS — J45991 Cough variant asthma: Secondary | ICD-10-CM

## 2019-02-04 DIAGNOSIS — N611 Abscess of the breast and nipple: Secondary | ICD-10-CM

## 2019-02-04 MED ORDER — SULFAMETHOXAZOLE-TRIMETHOPRIM 400-80 MG PO TABS: 1.0000 | ORAL_TABLET | Freq: Two times a day (BID) | ORAL | 0 refills | Status: DC

## 2019-02-04 NOTE — Progress Notes (Signed)
[facility] [address] [place], [place] [address]  Internal MEDICINE  Office Visit Note  Patient Name: Nichole Boyd  [id number]  [id number]  Date of Service: 02/06/2019  Chief Complaint  Patient presents with  . Breast Problem    lump under right breast, sunday right breast was sore, period was light, knot showed up on monday, today knot is quarter sized and very sore    HPI  Pt is seen with c/o pain and lump under her right breast, denies any trauma. She is not lactating, youngest child is 5 years of age. No family h/o breast cancer, denies any fever or chills, there is no discharge at the site of knot or in nipple. Pt does use inhaler more frequently than before    Current Medication: Outpatient Encounter Medications as of 02/04/2019  Medication Sig  . albuterol (VENTOLIN HFA) 108 (90 Base) MCG/ACT inhaler Inhale 2 puffs into the lungs every 6 (six) hours as needed.  . drospirenone-ethinyl estradiol (YAZ) 3-0.02 MG tablet Take 1 tablet by mouth daily.  . hydrOXYzine (ATARAX/VISTARIL) 50 MG tablet Take 1 tablet (50 mg total) by mouth 3 (three) times daily as needed for anxiety.  . sulfamethoxazole-trimethoprim (BACTRIM) 400-80 MG tablet Take 1 tablet by mouth 2 (two) times daily.  . [DISCONTINUED] metroNIDAZOLE (FLAGYL) 500 MG tablet Take 1 tablet (500 mg total) by mouth 2 (two) times daily. (Patient not taking: Reported on 02/04/2019)   No facility-administered encounter medications on file as of 02/04/2019.    Surgical History: Past Surgical History:  Procedure Laterality Date  . HYSTEROSCOPY    . LAPAROSCOPY      Medical History: Past Medical History:  Diagnosis Date  . Allergies   . Asthma 02/02/2018  . Endometriosis   . Kidney infection     Family History: Family History  Problem Relation Age of Onset  . Diabetes Mother   . Heart attack Father     Social History   Socioeconomic History  . Marital status: Single    Spouse name: Not on file    . Number of children: Not on file  . Years of education: Not on file  . Highest education level: Not on file  Occupational History  . Not on file  Tobacco Use  . Smoking status: Former Smoker    Types: Cigarettes  . Smokeless tobacco: Never Used  Substance and Sexual Activity  . Alcohol use: Not Currently  . Drug use: No  . Sexual activity: Yes    Birth control/protection: Condom  Other Topics Concern  . Not on file  Social History Narrative  . Not on file   Social Determinants of Health   Financial Resource Strain:   . Difficulty of Paying Living Expenses: Not on file  Food Insecurity:   . Worried About Programme researcher, broadcasting/film/video in the Last Year: Not on file  . Ran Out of Food in the Last Year: Not on file  Transportation Needs:   . Lack of Transportation (Medical): Not on file  . Lack of Transportation (Non-Medical): Not on file  Physical Activity:   . Days of Exercise per Week: Not on file  . Minutes of Exercise per Session: Not on file  Stress:   . Feeling of Stress : Not on file  Social Connections:   . Frequency of Communication with Friends and Family: Not on file  . Frequency of Social Gatherings with Friends and Family: Not on file  . Attends Religious Services: Not  on file  . Active Member of Clubs or Organizations: Not on file  . Attends Archivist Meetings: Not on file  . Marital Status: Not on file  Intimate Partner Violence:   . Fear of Current or Ex-Partner: Not on file  . Emotionally Abused: Not on file  . Physically Abused: Not on file  . Sexually Abused: Not on file    Review of Systems  Constitutional: Negative for appetite change and chills.  HENT: Negative.   Respiratory: Positive for cough and shortness of breath.   Cardiovascular: Positive for chest pain.       Right breast   Gastrointestinal: Negative.   Neurological: Negative.    Vital Signs: BP 124/73   Pulse 87   Temp 97.7 F (36.5 C)   Resp 16   Ht 5\' 3"  (1.6 m)   Wt  170 lb (77.1 kg)   SpO2 97%   BMI 30.11 kg/m   Physical Exam Constitutional:      Appearance: Normal appearance.  HENT:     Head: Normocephalic and atraumatic.  Cardiovascular:     Rate and Rhythm: Normal rate and regular rhythm.  Pulmonary:     Effort: Pulmonary effort is normal.     Breath sounds: Normal breath sounds.  Chest:     Breasts:        Right: Tenderness present.     Neurological:     Mental Status: She is alert.    Assessment/Plan: 1. Breast abscess of female - Start bactrim bid, might need I and D, mammogram / [place]   2. Cough variant asthma - Might need PFT, CXR  Continue Albuterol,   General Counseling: Nichole Boyd verbalizes understanding of the findings of todays visit and agrees with plan of treatment. I have discussed any further diagnostic evaluation that may be needed or ordered today. We also reviewed her medications today. she has been encouraged to call the office with any questions or concerns that should arise related to todays visit.   Meds ordered this encounter  Medications  . sulfamethoxazole-trimethoprim (BACTRIM) 400-80 MG tablet    Sig: Take 1 tablet by mouth 2 (two) times daily.    Dispense:  20 tablet    Refill:  0    Total time spent:25 Minutes Time spent includes review of chart, medications, test results, and follow up plan with the patient.    Dr Lavera Guise Internal medicine

## 2019-02-10 ENCOUNTER — Ambulatory Visit: Payer: 59 | Admitting: Adult Health

## 2019-02-10 ENCOUNTER — Encounter: Payer: Self-pay | Admitting: Adult Health

## 2019-02-10 VITALS — BP 118/70 | HR 83 | Temp 97.5°F | Wt 168.2 lb

## 2019-02-10 DIAGNOSIS — F411 Generalized anxiety disorder: Secondary | ICD-10-CM | POA: Diagnosis not present

## 2019-02-10 DIAGNOSIS — N611 Abscess of the breast and nipple: Secondary | ICD-10-CM

## 2019-02-10 MED ORDER — CIPROFLOXACIN HCL 500 MG PO TABS: 500.0000 mg | ORAL_TABLET | Freq: Two times a day (BID) | ORAL | 0 refills | Status: DC

## 2019-02-10 NOTE — Progress Notes (Signed)
[facility] [address] [place], [place] [address]  Internal MEDICINE  Office Visit Note  Patient Name: Nichole Boyd  [id number]  [id number]  Date of Service: 02/10/2019  Chief Complaint  Patient presents with  . Follow-up    stopped antibiotics because it made her feel sick  . Breast Problem    knot on right breast has gotten bigger and leaking clear and red fluid,still sore     HPI  Pt is here for follow up. She was seen on 02/04/19 and prescribed bactrim for breast abscess.  She was able to take the bactrim for 5 days before she could not tolerate it anymore.  She had diarrhea and stomach cramping.  She reports the breast area has become more tender and is now draining clear fluid, that has some blood in it.  She denies any fever.       Current Medication: Outpatient Encounter Medications as of 02/10/2019  Medication Sig  . albuterol (VENTOLIN HFA) 108 (90 Base) MCG/ACT inhaler Inhale 2 puffs into the lungs every 6 (six) hours as needed.  . drospirenone-ethinyl estradiol (YAZ) 3-0.02 MG tablet Take 1 tablet by mouth daily.  . hydrOXYzine (ATARAX/VISTARIL) 50 MG tablet Take 1 tablet (50 mg total) by mouth 3 (three) times daily as needed for anxiety.  . sulfamethoxazole-trimethoprim (BACTRIM) 400-80 MG tablet Take 1 tablet by mouth 2 (two) times daily.  . ciprofloxacin (CIPRO) 500 MG tablet Take 1 tablet (500 mg total) by mouth 2 (two) times daily.   No facility-administered encounter medications on file as of 02/10/2019.    Surgical History: Past Surgical History:  Procedure Laterality Date  . HYSTEROSCOPY    . LAPAROSCOPY      Medical History: Past Medical History:  Diagnosis Date  . Allergies   . Asthma 02/02/2018  . Endometriosis   . Kidney infection     Family History: Family History  Problem Relation Age of Onset  . Diabetes Mother   . Heart attack Father     Social History   Socioeconomic History  . Marital status: Single    Spouse name:  Not on file  . Number of children: Not on file  . Years of education: Not on file  . Highest education level: Not on file  Occupational History  . Not on file  Tobacco Use  . Smoking status: Former Smoker    Types: Cigarettes  . Smokeless tobacco: Never Used  Substance and Sexual Activity  . Alcohol use: Not Currently  . Drug use: No  . Sexual activity: Yes    Birth control/protection: Condom  Other Topics Concern  . Not on file  Social History Narrative  . Not on file   Social Determinants of Health   Financial Resource Strain:   . Difficulty of Paying Living Expenses: Not on file  Food Insecurity:   . Worried About Programme researcher, broadcasting/film/video in the Last Year: Not on file  . Ran Out of Food in the Last Year: Not on file  Transportation Needs:   . Lack of Transportation (Medical): Not on file  . Lack of Transportation (Non-Medical): Not on file  Physical Activity:   . Days of Exercise per Week: Not on file  . Minutes of Exercise per Session: Not on file  Stress:   . Feeling of Stress : Not on file  Social Connections:   . Frequency of Communication with Friends and Family: Not on file  . Frequency of Social Gatherings with  Friends and Family: Not on file  . Attends Religious Services: Not on file  . Active Member of Clubs or Organizations: Not on file  . Attends Archivist Meetings: Not on file  . Marital Status: Not on file  Intimate Partner Violence:   . Fear of Current or Ex-Partner: Not on file  . Emotionally Abused: Not on file  . Physically Abused: Not on file  . Sexually Abused: Not on file      Review of Systems  Constitutional: Negative for chills, fatigue and unexpected weight change.  HENT: Negative for congestion, rhinorrhea, sneezing and sore throat.   Eyes: Negative for photophobia, pain and redness.  Respiratory: Negative for cough, chest tightness and shortness of breath.   Cardiovascular: Negative for chest pain and palpitations.   Gastrointestinal: Negative for abdominal pain, constipation, diarrhea, nausea and vomiting.  Endocrine: Negative.   Genitourinary: Negative for dysuria and frequency.  Musculoskeletal: Negative for arthralgias, back pain, joint swelling and neck pain.  Skin: Negative for rash.  Allergic/Immunologic: Negative.   Neurological: Negative for tremors and numbness.  Hematological: Negative for adenopathy. Does not bruise/bleed easily.  Psychiatric/Behavioral: Negative for behavioral problems and sleep disturbance. The patient is not nervous/anxious.     Vital Signs: BP 118/70   Pulse 83   Temp (!) 97.5 F (36.4 C)   Wt 168 lb 3.2 oz (76.3 kg)   SpO2 97%   BMI 29.80 kg/m    Physical Exam Vitals and nursing note reviewed.  Constitutional:      General: She is not in acute distress.    Appearance: She is well-developed. She is not diaphoretic.  HENT:     Head: Normocephalic and atraumatic.     Mouth/Throat:     Pharynx: No oropharyngeal exudate.  Eyes:     Pupils: Pupils are equal, round, and reactive to light.  Neck:     Thyroid: No thyromegaly.     Vascular: No JVD.     Trachea: No tracheal deviation.  Cardiovascular:     Rate and Rhythm: Normal rate and regular rhythm.     Heart sounds: Normal heart sounds. No murmur. No friction rub. No gallop.   Pulmonary:     Effort: Pulmonary effort is normal. No respiratory distress.     Breath sounds: Normal breath sounds. No wheezing or rales.  Chest:     Chest wall: No tenderness.     Comments: Exam Chaperoned by Corlis Hove CMA Abdominal:     Palpations: Abdomen is soft.     Tenderness: There is no abdominal tenderness. There is no guarding.  Musculoskeletal:        General: Normal range of motion.     Cervical back: Normal range of motion and neck supple.  Lymphadenopathy:     Cervical: No cervical adenopathy.  Skin:    General: Skin is warm and dry.  Neurological:     Mental Status: She is alert and oriented to  person, place, and time.     Cranial Nerves: No cranial nerve deficit.  Psychiatric:        Behavior: Behavior normal.        Thought Content: Thought content normal.        Judgment: Judgment normal.    Assessment/Plan: 1. Breast abscess of female Advised patient to take entire course of antibiotics as prescribed with food. Pt should return to clinic in 7-10 days if symptoms fail to improve or new symptoms develop. Stat surgical referral placed for  possible I and D. - ciprofloxacin (CIPRO) 500 MG tablet; Take 1 tablet (500 mg total) by mouth 2 (two) times daily.  Dispense: 20 tablet; Refill: 0  2. Generalized anxiety disorder Stable, currently controlled.  General Counseling: Elya verbalizes understanding of the findings of todays visit and agrees with plan of treatment. I have discussed any further diagnostic evaluation that may be needed or ordered today. We also reviewed her medications today. she has been encouraged to call the office with any questions or concerns that should arise related to todays visit.    Orders Placed This Encounter  Procedures  . Ambulatory referral to General Surgery    Meds ordered this encounter  Medications  . ciprofloxacin (CIPRO) 500 MG tablet    Sig: Take 1 tablet (500 mg total) by mouth 2 (two) times daily.    Dispense:  20 tablet    Refill:  0    Time spent: 20 Minutes   This patient was seen by Blima Ledger AGNP-C in Collaboration with Dr Lyndon Code as a part of collaborative care agreement     Johnna Acosta AGNP-C Internal medicine

## 2019-02-11 ENCOUNTER — Ambulatory Visit: Payer: 59 | Admitting: Adult Health

## 2019-02-11 ENCOUNTER — Ambulatory Visit: Payer: 59 | Admitting: General Surgery

## 2019-02-11 ENCOUNTER — Ambulatory Visit (INDEPENDENT_AMBULATORY_CARE_PROVIDER_SITE_OTHER): Payer: 59

## 2019-02-11 ENCOUNTER — Ambulatory Visit: Payer: 59 | Admitting: Nurse Practitioner

## 2019-02-11 ENCOUNTER — Encounter: Payer: Self-pay | Admitting: General Surgery

## 2019-02-11 ENCOUNTER — Other Ambulatory Visit: Payer: Self-pay

## 2019-02-11 ENCOUNTER — Ambulatory Visit: Payer: Self-pay

## 2019-02-11 VITALS — BP 115/75 | HR 86 | Temp 97.9°F | Resp 12 | Ht 63.0 in | Wt 170.0 lb

## 2019-02-11 DIAGNOSIS — N611 Abscess of the breast and nipple: Secondary | ICD-10-CM

## 2019-02-11 MED ORDER — CLINDAMYCIN HCL 300 MG PO CAPS: 300.0000 mg | ORAL_CAPSULE | Freq: Three times a day (TID) | ORAL | 0 refills | Status: AC

## 2019-02-11 NOTE — Patient Instructions (Addendum)
Dr.Cannon attempted to aspirated the area of concern at today's visit. She also prescribed patient Clindamycin antibiotic for 10 days at today's visit. Patient is to give our office a call if she experiencing diarrhea or noticed that the antibiotic isn't working after completed course of treatment. Patient may continue taking Tylenol or Ibuprofen to help with the pain and discomfort.

## 2019-02-13 NOTE — Progress Notes (Signed)
Patient ID: Nichole Boyd, female   DOB: [birth year], 34 y.o.   MRN: [id number]  Chief Complaint  Patient presents with  . New Patient (Initial Visit)    new pt ref Blima Ledger NP RT Breast Abscess requested this time    HPI Nichole Boyd is a 34 y.o. female.   She was referred by nurse practitioner, Blima Ledger, for evaluation of a possible breast abscess.  Ms. Meanor states that she had some right breast tenderness on Sunday, January 31.  A lump appeared at the inner most aspect of her breast, adjacent to the sternum and just above the inframammary crease, on Monday, February 1.  She sought care at her primary care office on February 2, stating that the lump was about the size of a quarter and was very sore.  No imaging was performed, but she was started on Bactrim.  She was only able to tolerate the Bactrim for a few days, secondary to GI upset.  She was seen again in the primary care provider's office on February 8, stating the area was more painful and had drained some clear to slightly red-tinged fluid.  A surgical referral was placed and she was prescribed ciprofloxacin.  She has not filled this prescription yet.  She is not currently breast-feeding; her youngest child is 36 years old.  She denies prior history of breast abscess.  No family history of breast cancer.  Her menses were about 2 weeks ago.  She denies any fevers or chills.  Other than the side effects from the Bactrim, she has not experienced any nausea or vomiting.  She has not noticed any purulent drainage from the lump.  The tenderness is localized to the right breast.  She does feel like the entire breast is a little bit more tense, compared to the left.  She denies any nipple discharge or other skin changes.  She has not appreciated any other masses in either breast.   Past Medical History:  Diagnosis Date  . Allergies   . Asthma 02/02/2018  . Endometriosis   . Kidney infection     Past Surgical History:    Procedure Laterality Date  . HYSTEROSCOPY    . LAPAROSCOPY      Family History  Problem Relation Age of Onset  . Diabetes Mother   . Heart attack Father     Social History Social History   Tobacco Use  . Smoking status: Former Smoker    Types: Cigarettes  . Smokeless tobacco: Never Used  Substance Use Topics  . Alcohol use: Not Currently  . Drug use: No    Allergies  Allergen Reactions  . Amoxicillin Hives    Current Outpatient Medications  Medication Sig Dispense Refill  . albuterol (VENTOLIN HFA) 108 (90 Base) MCG/ACT inhaler Inhale 2 puffs into the lungs every 6 (six) hours as needed. 1 Inhaler 0  . ciprofloxacin (CIPRO) 500 MG tablet Take 1 tablet (500 mg total) by mouth 2 (two) times daily. 20 tablet 0  . drospirenone-ethinyl estradiol (YAZ) 3-0.02 MG tablet Take 1 tablet by mouth daily. 1 Package 11  . hydrOXYzine (ATARAX/VISTARIL) 50 MG tablet Take 1 tablet (50 mg total) by mouth 3 (three) times daily as needed for anxiety. 90 tablet 2  . clindamycin (CLEOCIN) 300 MG capsule Take 1 capsule (300 mg total) by mouth 3 (three) times daily for 10 days. 30 capsule 0  . Multiple Vitamin (MULTIVITAMIN ADULT PO) Take by mouth.    . sulfamethoxazole-trimethoprim (  BACTRIM) 400-80 MG tablet Take 1 tablet by mouth 2 (two) times daily. (Patient not taking: Reported on 02/11/2019) 20 tablet 0   No current facility-administered medications for this visit.    Review of Systems Review of Systems  Respiratory: Positive for shortness of breath.   All other systems reviewed and are negative.   Blood pressure 115/75, pulse 86, temperature 97.9 F (36.6 C), temperature source Temporal, resp. rate 12, height 5\' 3"  (1.6 m), weight 170 lb (77.1 kg), SpO2 97 %. Body mass index is 30.11 kg/m.  Physical Exam Physical Exam Vitals reviewed. Exam conducted with a chaperone present.  Constitutional:      General: She is not in acute distress.    Appearance: Normal appearance. She is  obese.  HENT:     Head: Normocephalic and atraumatic.     Nose:     Comments: Covered with a mask secondary to COVID-19 precautions    Mouth/Throat:     Comments: Covered with a mask secondary to COVID-19 precautions Eyes:     General: No scleral icterus.       Right eye: No discharge.        Left eye: No discharge.     Conjunctiva/sclera: Conjunctivae normal.  Neck:     Comments: No palpable thyromegaly or dominant thyroid masses appreciated. Cardiovascular:     Rate and Rhythm: Normal rate and regular rhythm.     Pulses: Normal pulses.  Pulmonary:     Effort: Pulmonary effort is normal.     Breath sounds: Normal breath sounds.  Chest:     Breasts:        Left: Normal.       Comments: There is a tender raised area within the skin of the right breast.  No central pore is identified.  No material or fluid is able to be expressed.  I do not appreciate any fluctuance associated with the area.  The remainder of the bilateral breast exam was normal. Abdominal:     General: Bowel sounds are normal.     Palpations: Abdomen is soft.  Genitourinary:    Comments: Deferred Musculoskeletal:     Cervical back: Normal range of motion. No rigidity.     Right lower leg: No edema.     Left lower leg: No edema.  Lymphadenopathy:     Cervical: No cervical adenopathy.     Upper Body:     Right upper body: No supraclavicular, axillary or pectoral adenopathy.     Left upper body: No supraclavicular, axillary or pectoral adenopathy.  Skin:    General: Skin is warm and dry.  Neurological:     General: No focal deficit present.     Mental Status: She is alert and oriented to person, place, and time.  Psychiatric:        Mood and Affect: Mood normal.        Behavior: Behavior normal.     Data Reviewed I reviewed the clinic visit notes from February 2 and February 8, as discussed under the history of present illness.  There are no radiographic images or relevant labs to  review.  Assessment This is a 34 year old woman with what feels like a small cutaneous lesion in the skin of her right breast, just adjacent to the sternum.  The physical exam findings are not completely consistent with an abscess.  I put an ultrasound probe on the area and did not identify any fluid cavity.  There did appear to be potentially  a lymph node in this region.  It had normal morphology.  Plan I elected not to incise the site, as there was no fluid appreciated on the sonographic study.  I offered the patient an attempted aspiration using palpation.  She agreed that she would like to have me do this today.  The site was infiltrated with local anesthetic.  I introduced a 22-gauge needle into the palpable area of concern.  No fluid could be aspirated.  Due to the patient's concerns, growth of the area, and tenderness, I elected to place her on a course of clindamycin, as she is allergic to penicillins.  If the area does not resolve with this treatment, she will likely need a formal imaging study to better assess the area.  She will contact our office.  She may continue to take Tylenol or ibuprofen to help alleviate her discomfort.    Fredirick Maudlin 02/13/2019, 12:14 PM

## 2019-03-06 ENCOUNTER — Telehealth: Payer: Self-pay

## 2019-03-06 NOTE — Telephone Encounter (Signed)
Confirmed appointment on 03/10/2019 and screened for covid. klh °

## 2019-03-10 ENCOUNTER — Encounter: Payer: Self-pay | Admitting: Adult Health

## 2019-03-10 ENCOUNTER — Other Ambulatory Visit: Payer: Self-pay

## 2019-03-10 ENCOUNTER — Ambulatory Visit: Payer: 59 | Admitting: Adult Health

## 2019-03-10 VITALS — BP 122/79 | HR 89 | Temp 98.0°F | Resp 16 | Ht 63.0 in | Wt 170.0 lb

## 2019-03-10 DIAGNOSIS — M79602 Pain in left arm: Secondary | ICD-10-CM

## 2019-03-10 DIAGNOSIS — R202 Paresthesia of skin: Secondary | ICD-10-CM

## 2019-03-10 DIAGNOSIS — F411 Generalized anxiety disorder: Secondary | ICD-10-CM

## 2019-03-10 DIAGNOSIS — J4520 Mild intermittent asthma, uncomplicated: Secondary | ICD-10-CM | POA: Diagnosis not present

## 2019-03-10 DIAGNOSIS — N611 Abscess of the breast and nipple: Secondary | ICD-10-CM

## 2019-03-10 DIAGNOSIS — M79601 Pain in right arm: Secondary | ICD-10-CM

## 2019-03-10 MED ORDER — ALBUTEROL SULFATE HFA 108 (90 BASE) MCG/ACT IN AERS: 2.0000 | INHALATION_SPRAY | Freq: Four times a day (QID) | RESPIRATORY_TRACT | 1 refills | Status: DC | PRN

## 2019-03-10 NOTE — Progress Notes (Signed)
[facility] [place], [place] [address]  Internal MEDICINE  Office Visit Note  Patient Name: Nichole Boyd  [id number]  [id number]  Date of Service: 03/10/2019  Chief Complaint  Patient presents with  . Follow-up    joints in both elbows hurt, really sore in the morning  . Medication Refill    inhaler  . Hip Pain    lower back to middle of legs sore no exercise     HPI  Pt is here for follow up.  She reports she has been having bilateral arm pain and bilateral thigh pain that origonates in her back. She denies any trauma, or previous back issues. This has been present for a few weeks.  She also has asthma for which she needs a refill on her inhaler.     Current Medication: Outpatient Encounter Medications as of 03/10/2019  Medication Sig  . albuterol (VENTOLIN HFA) 108 (90 Base) MCG/ACT inhaler Inhale 2 puffs into the lungs every 6 (six) hours as needed for wheezing.  . ciprofloxacin (CIPRO) 500 MG tablet Take 1 tablet (500 mg total) by mouth 2 (two) times daily.  . drospirenone-ethinyl estradiol (YAZ) 3-0.02 MG tablet Take 1 tablet by mouth daily.  . hydrOXYzine (ATARAX/VISTARIL) 50 MG tablet Take 1 tablet (50 mg total) by mouth 3 (three) times daily as needed for anxiety.  . Multiple Vitamin (MULTIVITAMIN ADULT PO) Take by mouth.  . sulfamethoxazole-trimethoprim (BACTRIM) 400-80 MG tablet Take 1 tablet by mouth 2 (two) times daily.  . [DISCONTINUED] albuterol (VENTOLIN HFA) 108 (90 Base) MCG/ACT inhaler Inhale 2 puffs into the lungs every 6 (six) hours as needed.   No facility-administered encounter medications on file as of 03/10/2019.    Surgical History: Past Surgical History:  Procedure Laterality Date  . HYSTEROSCOPY    . LAPAROSCOPY      Medical History: Past Medical History:  Diagnosis Date  . Allergies   . Asthma 02/02/2018  . Endometriosis   . Kidney infection     Family History: Family History  Problem Relation Age of Onset   . Diabetes Mother   . Heart attack Father     Social History   Socioeconomic History  . Marital status: Single    Spouse name: Not on file  . Number of children: Not on file  . Years of education: Not on file  . Highest education level: Not on file  Occupational History  . Not on file  Tobacco Use  . Smoking status: Former Smoker    Types: Cigarettes  . Smokeless tobacco: Never Used  Substance and Sexual Activity  . Alcohol use: Not Currently  . Drug use: No  . Sexual activity: Yes    Birth control/protection: Condom  Other Topics Concern  . Not on file  Social History Narrative  . Not on file   Social Determinants of Health   Financial Resource Strain:   . Difficulty of Paying Living Expenses: Not on file  Food Insecurity:   . Worried About Charity fundraiser in the Last Year: Not on file  . Ran Out of Food in the Last Year: Not on file  Transportation Needs:   . Lack of Transportation (Medical): Not on file  . Lack of Transportation (Non-Medical): Not on file  Physical Activity:   . Days of Exercise per Week: Not on file  . Minutes of Exercise per Session: Not on file  Stress:   . Feeling of Stress : Not on  file  Social Connections:   . Frequency of Communication with Friends and Family: Not on file  . Frequency of Social Gatherings with Friends and Family: Not on file  . Attends Religious Services: Not on file  . Active Member of Clubs or Organizations: Not on file  . Attends Banker Meetings: Not on file  . Marital Status: Not on file  Intimate Partner Violence:   . Fear of Current or Ex-Partner: Not on file  . Emotionally Abused: Not on file  . Physically Abused: Not on file  . Sexually Abused: Not on file      Review of Systems  Constitutional: Negative for chills, fatigue and unexpected weight change.  HENT: Negative for congestion, rhinorrhea, sneezing and sore throat.   Eyes: Negative for photophobia, pain and redness.   Respiratory: Negative for cough, chest tightness and shortness of breath.   Cardiovascular: Negative for chest pain and palpitations.  Gastrointestinal: Negative for abdominal pain, constipation, diarrhea, nausea and vomiting.  Endocrine: Negative.   Genitourinary: Negative for dysuria and frequency.  Musculoskeletal: Negative for arthralgias, back pain, joint swelling and neck pain.  Skin: Negative for rash.  Allergic/Immunologic: Negative.   Neurological: Negative for tremors and numbness.  Hematological: Negative for adenopathy. Does not bruise/bleed easily.  Psychiatric/Behavioral: Negative for behavioral problems and sleep disturbance. The patient is not nervous/anxious.     Vital Signs: BP 122/79   Pulse 89   Temp 98 F (36.7 C)   Resp 16   Ht 5\' 3"  (1.6 m)   Wt 170 lb (77.1 kg)   SpO2 100%   BMI 30.11 kg/m    Physical Exam Vitals and nursing note reviewed.  Constitutional:      General: She is not in acute distress.    Appearance: She is well-developed. She is not diaphoretic.  HENT:     Head: Normocephalic and atraumatic.     Mouth/Throat:     Pharynx: No oropharyngeal exudate.  Eyes:     Pupils: Pupils are equal, round, and reactive to light.  Neck:     Thyroid: No thyromegaly.     Vascular: No JVD.     Trachea: No tracheal deviation.  Cardiovascular:     Rate and Rhythm: Normal rate and regular rhythm.     Heart sounds: Normal heart sounds. No murmur. No friction rub. No gallop.   Pulmonary:     Effort: Pulmonary effort is normal. No respiratory distress.     Breath sounds: Normal breath sounds. No wheezing or rales.  Chest:     Chest wall: No tenderness.  Abdominal:     Palpations: Abdomen is soft.     Tenderness: There is no abdominal tenderness. There is no guarding.  Musculoskeletal:        General: Normal range of motion.     Cervical back: Normal range of motion and neck supple.  Lymphadenopathy:     Cervical: No cervical adenopathy.  Skin:     General: Skin is warm and dry.  Neurological:     Mental Status: She is alert and oriented to person, place, and time.     Cranial Nerves: No cranial nerve deficit.  Psychiatric:        Behavior: Behavior normal.        Thought Content: Thought content normal.        Judgment: Judgment normal.    Assessment/Plan: 1. Mild intermittent asthma, unspecified whether complicated Get PFT and continue ventolin as directed.  - albuterol (  VENTOLIN HFA) 108 (90 Base) MCG/ACT inhaler; Inhale 2 puffs into the lungs every 6 (six) hours as needed for wheezing.  Dispense: 54 g; Refill: 1 - Pulmonary Function Test; Future  2. Paresthesia and pain of both upper extremities Cervical spine xray - DG Cervical Spine Complete; Future  3. Paresthesia of both lower extremities Lumbar spine xray - DG Lumbar Spine Complete; Future  4. Breast abscess of female Patient followed up with surgery for eval.  No abscess determined.  This area has nearly resolved at this time with antibiotics.    5. Generalized anxiety disorder Controlled, continue present management.   General Counseling: Lady verbalizes understanding of the findings of todays visit and agrees with plan of treatment. I have discussed any further diagnostic evaluation that may be needed or ordered today. We also reviewed her medications today. she has been encouraged to call the office with any questions or concerns that should arise related to todays visit.    No orders of the defined types were placed in this encounter.   Meds ordered this encounter  Medications  . albuterol (VENTOLIN HFA) 108 (90 Base) MCG/ACT inhaler    Sig: Inhale 2 puffs into the lungs every 6 (six) hours as needed for wheezing.    Dispense:  54 g    Refill:  1    Please given 90 day supply 3 inhalers    Time spent: 25 Minutes   This patient was seen by Blima Ledger AGNP-C in Collaboration with Dr Lyndon Code as a part of collaborative care agreement      Johnna Acosta AGNP-C Internal medicine

## 2019-03-18 ENCOUNTER — Telehealth: Payer: Self-pay

## 2019-03-18 NOTE — Telephone Encounter (Signed)
Confirmed and screened for appt 03/19/19  

## 2019-03-19 ENCOUNTER — Ambulatory Visit: Payer: 59 | Admitting: Internal Medicine

## 2019-03-19 ENCOUNTER — Other Ambulatory Visit: Payer: Self-pay

## 2019-03-19 DIAGNOSIS — J4520 Mild intermittent asthma, uncomplicated: Secondary | ICD-10-CM

## 2019-03-19 LAB — PULMONARY FUNCTION TEST

## 2019-03-20 ENCOUNTER — Telehealth: Payer: Self-pay

## 2019-03-20 NOTE — Telephone Encounter (Signed)
Called lmom informing patient of appointment on 03/24/2019. klh 

## 2019-03-24 ENCOUNTER — Ambulatory Visit: Payer: 59 | Admitting: Adult Health

## 2019-04-09 NOTE — Procedures (Signed)
[facility] [address] [place], [address]  DATE OF SERVICE: March 19, 2019  Complete Pulmonary Function Testing Interpretation:  FINDINGS:  Forced vital capacity is mildly decreased.  The FEV1 is 2.47 L which is 81% of predicted and normal to mildly decreased.  FEV1 FVC ratio is normal.  Postbronchodilator no significant change in the FEV1 however clinical improvement may occur in the absence of spirometric improvement.  Total lung capacity is mildly decreased residual volume is decreased residual volume total lung capacity ratio is decreased FRC is decreased.  DLCO is normal.  IMPRESSION:  This pulmonary function study is suggestive of mild restrictive lung disease.  Patient did have some difficulty performing certain maneuvers so clinical correlation is strongly recommended.  There is no change with bronchodilators  Yevonne Pax, MD [facility] Pulmonary Critical Care Medicine Sleep Medicine

## 2019-06-20 ENCOUNTER — Telehealth: Payer: Self-pay

## 2019-06-20 NOTE — Telephone Encounter (Signed)
Confirmed and screened for 06-24-19 ov. °

## 2019-06-24 ENCOUNTER — Other Ambulatory Visit: Payer: Self-pay

## 2019-06-24 ENCOUNTER — Ambulatory Visit (INDEPENDENT_AMBULATORY_CARE_PROVIDER_SITE_OTHER): Payer: 59 | Admitting: Nurse Practitioner

## 2019-06-24 ENCOUNTER — Encounter: Payer: Self-pay | Admitting: Nurse Practitioner

## 2019-06-24 VITALS — BP 120/84 | HR 97 | Temp 97.7°F | Resp 16 | Ht 63.0 in | Wt 169.2 lb

## 2019-06-24 DIAGNOSIS — Z124 Encounter for screening for malignant neoplasm of cervix: Secondary | ICD-10-CM | POA: Diagnosis not present

## 2019-06-24 DIAGNOSIS — J4520 Mild intermittent asthma, uncomplicated: Secondary | ICD-10-CM

## 2019-06-24 DIAGNOSIS — R300 Dysuria: Secondary | ICD-10-CM | POA: Diagnosis not present

## 2019-06-24 DIAGNOSIS — Z0001 Encounter for general adult medical examination with abnormal findings: Secondary | ICD-10-CM | POA: Diagnosis not present

## 2019-06-24 MED ORDER — ALBUTEROL SULFATE HFA 108 (90 BASE) MCG/ACT IN AERS: 2.0000 | INHALATION_SPRAY | Freq: Four times a day (QID) | RESPIRATORY_TRACT | 1 refills | Status: DC | PRN

## 2019-06-24 NOTE — Progress Notes (Signed)
[facility] [address] [place], [place] [address]  Internal MEDICINE  Office Visit Note  Patient Name: Nichole Boyd  [id number]  [id number]  Date of Service: 07/02/2019   Pt is here for routine health maintenance examination   Chief Complaint  Patient presents with  . Annual Exam    Papsmear  . Asthma     The patient is here for health maintenance exam and pap smear. She states that in the beginning for 2020 she developed severe respiratory infection with difficulty breathing. Had to be on several different antibiotics. She was sick for approximately four weeks. After recovering, she continued to need to use rescue inhaler. Wheezing and shortness of breath was especially bad when she would be outside in the yard or even when having to talk a lot. Was using her rescue inhaler at least once per day. Now that pollen has decreased some, she is only having to use rescue inhaler about once weekly, down from daily. She did have pulmonary function test done. This did show mild restrictive airway disease. We should repeat this study in one year for surveillance.   Current Medication: Outpatient Encounter Medications as of 06/24/2019  Medication Sig  . albuterol (VENTOLIN HFA) 108 (90 Base) MCG/ACT inhaler Inhale 2 puffs into the lungs every 6 (six) hours as needed for wheezing.  . drospirenone-ethinyl estradiol (YAZ) 3-0.02 MG tablet Take 1 tablet by mouth daily.  . hydrOXYzine (ATARAX/VISTARIL) 50 MG tablet Take 1 tablet (50 mg total) by mouth 3 (three) times daily as needed for anxiety.  . Multiple Vitamin (MULTIVITAMIN ADULT PO) Take by mouth.  . [DISCONTINUED] albuterol (VENTOLIN HFA) 108 (90 Base) MCG/ACT inhaler Inhale 2 puffs into the lungs every 6 (six) hours as needed for wheezing.  . [DISCONTINUED] ciprofloxacin (CIPRO) 500 MG tablet Take 1 tablet (500 mg total) by mouth 2 (two) times daily.  . [DISCONTINUED] sulfamethoxazole-trimethoprim (BACTRIM) 400-80 MG tablet  Take 1 tablet by mouth 2 (two) times daily.   No facility-administered encounter medications on file as of 06/24/2019.    Surgical History: Past Surgical History:  Procedure Laterality Date  . HYSTEROSCOPY    . LAPAROSCOPY      Medical History: Past Medical History:  Diagnosis Date  . Allergies   . Asthma 02/02/2018  . Endometriosis   . Kidney infection     Family History: Family History  Problem Relation Age of Onset  . Diabetes Mother   . Heart attack Father       Review of Systems  Constitutional: Negative for activity change, chills, fatigue and unexpected weight change.  HENT: Negative for congestion, postnasal drip, rhinorrhea, sneezing and sore throat.   Respiratory: Positive for wheezing. Negative for cough, chest tightness and shortness of breath.        Patient has developed persistent asthma since URI in 03/2018.   Cardiovascular: Negative for chest pain and palpitations.  Gastrointestinal: Negative for abdominal pain, constipation, diarrhea, nausea and vomiting.  Endocrine: Negative for cold intolerance, heat intolerance, polydipsia and polyuria.  Genitourinary: Negative for dysuria, frequency, menstrual problem and urgency.  Musculoskeletal: Negative for arthralgias, back pain, joint swelling and neck pain.  Skin: Negative for rash.  Neurological: Negative for dizziness, tremors, numbness and headaches.  Hematological: Negative for adenopathy. Does not bruise/bleed easily.  Psychiatric/Behavioral: Negative for behavioral problems (Depression), sleep disturbance and suicidal ideas. The patient is not nervous/anxious.      Today's Vitals   06/24/19 1518  BP: 120/84  Pulse: 97  Resp: 16  Temp: 97.7 F (36.5 C)  SpO2: 97%  Weight: 169 lb 3.2 oz (76.7 kg)  Height: 5\' 3"  (1.6 m)   Body mass index is 29.97 kg/m.  Physical Exam Vitals and nursing note reviewed.  Constitutional:      General: She is not in acute distress.    Appearance: Normal  appearance. She is well-developed. She is not diaphoretic.  HENT:     Head: Normocephalic and atraumatic.     Nose: Nose normal.     Mouth/Throat:     Pharynx: No oropharyngeal exudate.  Eyes:     Pupils: Pupils are equal, round, and reactive to light.  Neck:     Thyroid: No thyromegaly.     Vascular: No JVD.     Trachea: No tracheal deviation.  Cardiovascular:     Rate and Rhythm: Normal rate and regular rhythm.     Pulses: Normal pulses.     Heart sounds: Normal heart sounds. No murmur heard.  No friction rub. No gallop.   Pulmonary:     Effort: Pulmonary effort is normal. No respiratory distress.     Breath sounds: Normal breath sounds. No wheezing or rales.  Chest:     Chest wall: No tenderness.     Breasts:        Right: Normal. No swelling, bleeding, inverted nipple, mass, nipple discharge, skin change or tenderness.        Left: Normal. No swelling, bleeding, inverted nipple, mass, nipple discharge, skin change or tenderness.  Abdominal:     General: Bowel sounds are normal.     Palpations: Abdomen is soft.     Tenderness: There is no abdominal tenderness.     Hernia: There is no hernia in the left inguinal area or right inguinal area.  Genitourinary:    General: Normal vulva.     Exam position: Supine.     Labia:        Right: No rash, tenderness or lesion.        Left: No rash, tenderness or lesion.      Vagina: Normal. No vaginal discharge, erythema, tenderness, bleeding or lesions.     Cervix: No cervical motion tenderness, discharge, friability, lesion or erythema.     Uterus: Normal.      Adnexa: Right adnexa normal and left adnexa normal.     Comments: No tenderness, masses, or organomeglay present during bimanual exam . Musculoskeletal:        General: Normal range of motion.     Cervical back: Normal range of motion and neck supple.  Lymphadenopathy:     Cervical: No cervical adenopathy.     Upper Body:     Right upper body: No axillary adenopathy.      Left upper body: No axillary adenopathy.     Lower Body: No right inguinal adenopathy. No left inguinal adenopathy.  Skin:    General: Skin is warm and dry.  Neurological:     Mental Status: She is alert and oriented to person, place, and time.     Cranial Nerves: No cranial nerve deficit.  Psychiatric:        Mood and Affect: Mood normal.        Behavior: Behavior normal.        Thought Content: Thought content normal.        Judgment: Judgment normal.      LABS: Recent Results (from the past 2160 hour(s))  Urinalysis, Routine w reflex microscopic  Status: None   Collection Time: 06/24/19  3:05 PM  Result Value Ref Range   Specific Gravity, UA 1.021 1.005 - 1.030   pH, UA 5.5 5.0 - 7.5   Color, UA Yellow Yellow   Appearance Ur Clear Clear   Leukocytes,UA Negative Negative   Protein,UA Negative Negative/Trace   Glucose, UA Negative Negative   Ketones, UA Negative Negative   RBC, UA Negative Negative   Bilirubin, UA Negative Negative   Urobilinogen, Ur 0.2 0.2 - 1.0 mg/dL   Nitrite, UA Negative Negative   Microscopic Examination Comment     Comment: Microscopic not indicated and not performed.  IGP, Aptima HPV     Status: None   Collection Time: 06/24/19  4:00 PM  Result Value Ref Range   Interpretation NILM     Comment: NEGATIVE FOR INTRAEPITHELIAL LESION OR MALIGNANCY.   Category NIL     Comment: Negative for Intraepithelial Lesion   Adequacy ENDO     Comment: Satisfactory for evaluation. Endocervical and/or squamous metaplastic cells (endocervical component) are present.    Clinician Provided ICD10 Comment     Comment: Z12.4   Performed by: Comment     Comment: Saunders Revel, Cytotechnologist (ASCP)   Note: Comment     Comment: The Pap smear is a screening test designed to aid in the detection of premalignant and malignant conditions of the uterine cervix.  It is not a diagnostic procedure and should not be used as the sole means of detecting cervical cancer.   Both false-positive and false-negative reports do occur.    Test Methodology Comment     Comment: This liquid based ThinPrep(R) pap test was screened with the use of an image guided system.    HPV Aptima Negative Negative    Comment: This nucleic acid amplification test detects fourteen high-risk HPV types (16,18,31,33,35,39,45,51,52,56,58,59,66,68) without differentiation.     Assessment/Plan: 1. Encounter for general adult medical examination with abnormal findings Annual health maintenance exam and pap smear today.   2. Mild intermittent asthma, unspecified whether complicated Reviewed results of pulmonary function test. This does show mild reactive airway disease. She should continue to use rescue inhaler as needed and as prescribed. Will repeat PFT in one year for surveillance.  - albuterol (VENTOLIN HFA) 108 (90 Base) MCG/ACT inhaler; Inhale 2 puffs into the lungs every 6 (six) hours as needed for wheezing.  Dispense: 54 g; Refill: 1  3. Routine cervical smear - IGP, Aptima HPV  4. Dysuria - Urinalysis, Routine w reflex microscopic  General Counseling: Shajuan verbalizes understanding of the findings of todays visit and agrees with plan of treatment. I have discussed any further diagnostic evaluation that may be needed or ordered today. We also reviewed her medications today. she has been encouraged to call the office with any questions or concerns that should arise related to todays visit.    Counseling:  This patient was seen by Vincent Gros FNP Collaboration with Dr Lyndon Code as a part of collaborative care agreement  Orders Placed This Encounter  Procedures  . Urinalysis, Routine w reflex microscopic    Meds ordered this encounter  Medications  . albuterol (VENTOLIN HFA) 108 (90 Base) MCG/ACT inhaler    Sig: Inhale 2 puffs into the lungs every 6 (six) hours as needed for wheezing.    Dispense:  54 g    Refill:  1    Please given 90 day supply 3 inhalers     Order Specific Question:   Supervising  Provider    Answer:   Lyndon Code [[id number]]    Total time spent: 45 Minutes  Time spent includes review of chart, medications, test results, and follow up plan with the patient.     Lyndon Code, MD  Internal Medicine

## 2019-06-25 LAB — URINALYSIS, ROUTINE W REFLEX MICROSCOPIC
Bilirubin, UA: NEGATIVE
Glucose, UA: NEGATIVE
Ketones, UA: NEGATIVE
Leukocytes,UA: NEGATIVE
Nitrite, UA: NEGATIVE
Protein,UA: NEGATIVE
RBC, UA: NEGATIVE
Specific Gravity, UA: 1.021 (ref 1.005–1.030)
Urobilinogen, Ur: 0.2 mg/dL (ref 0.2–1.0)
pH, UA: 5.5 (ref 5.0–7.5)

## 2019-06-26 LAB — IGP, APTIMA HPV: HPV Aptima: NEGATIVE

## 2019-07-02 ENCOUNTER — Telehealth: Payer: Self-pay

## 2019-07-02 DIAGNOSIS — Z124 Encounter for screening for malignant neoplasm of cervix: Secondary | ICD-10-CM | POA: Insufficient documentation

## 2019-07-02 DIAGNOSIS — R300 Dysuria: Secondary | ICD-10-CM | POA: Insufficient documentation

## 2019-07-02 DIAGNOSIS — Z0001 Encounter for general adult medical examination with abnormal findings: Secondary | ICD-10-CM | POA: Insufficient documentation

## 2019-07-02 DIAGNOSIS — J4520 Mild intermittent asthma, uncomplicated: Secondary | ICD-10-CM | POA: Insufficient documentation

## 2019-07-02 NOTE — Telephone Encounter (Signed)
-----   Message from Carlean Jews, NP sent at 07/02/2019  3:17 PM EDT ----- Please let the patient know that her pap smear is normal. Thanks.

## 2019-07-02 NOTE — Telephone Encounter (Signed)
Pt.notified

## 2019-07-02 NOTE — Progress Notes (Signed)
Please let the patient know that her pap smear is normal. Thanks.

## 2019-07-03 ENCOUNTER — Ambulatory Visit: Payer: 59 | Admitting: Adult Health

## 2019-07-03 ENCOUNTER — Encounter: Payer: Self-pay | Admitting: Nurse Practitioner

## 2019-07-03 VITALS — Ht 63.0 in | Wt 170.0 lb

## 2019-07-03 DIAGNOSIS — J0110 Acute frontal sinusitis, unspecified: Secondary | ICD-10-CM

## 2019-07-03 MED ORDER — CLARITHROMYCIN 500 MG PO TABS: 500.0000 mg | ORAL_TABLET | Freq: Two times a day (BID) | ORAL | 0 refills | Status: DC

## 2019-07-03 NOTE — Progress Notes (Signed)
[facility] [address] [place], [place] [address]  Internal MEDICINE  Telephone Visit  Patient Name: Nichole Boyd  [id number]  [id number]  Date of Service: 07/07/2019  I connected with the patient at 1215 by telephone and verified the patients identity using two identifiers.   I discussed the limitations, risks, security and privacy concerns of performing an evaluation and management service by telephone and the availability of in person appointments. I also discussed with the patient that there may be a patient responsible charge related to the service.  The patient expressed understanding and agrees to proceed.    Chief Complaint  Patient presents with  . Telephone Screen  . Telephone Assessment  . Sinusitis    going on for week  . Ear Pain    left start today  . Headache    HPI  Pt seen via telephone.  She reports left ear discomfort for 2 days.  She reports decreased hearing in this ear.  She also noticed some sore throat this morning, and a enlarged lymph node.     Current Medication: Outpatient Encounter Medications as of 07/03/2019  Medication Sig  . albuterol (VENTOLIN HFA) 108 (90 Base) MCG/ACT inhaler Inhale 2 puffs into the lungs every 6 (six) hours as needed for wheezing.  . drospirenone-ethinyl estradiol (YAZ) 3-0.02 MG tablet Take 1 tablet by mouth daily.  . hydrOXYzine (ATARAX/VISTARIL) 50 MG tablet Take 1 tablet (50 mg total) by mouth 3 (three) times daily as needed for anxiety.  . Multiple Vitamin (MULTIVITAMIN ADULT PO) Take by mouth.  . clarithromycin (BIAXIN) 500 MG tablet Take 1 tablet (500 mg total) by mouth 2 (two) times daily.   No facility-administered encounter medications on file as of 07/03/2019.    Surgical History: Past Surgical History:  Procedure Laterality Date  . HYSTEROSCOPY    . LAPAROSCOPY      Medical History: Past Medical History:  Diagnosis Date  . Allergies   . Asthma 02/02/2018  . Endometriosis   . Kidney  infection     Family History: Family History  Problem Relation Age of Onset  . Diabetes Mother   . Heart attack Father     Social History   Socioeconomic History  . Marital status: Single    Spouse name: Not on file  . Number of children: Not on file  . Years of education: Not on file  . Highest education level: Not on file  Occupational History  . Not on file  Tobacco Use  . Smoking status: Former Smoker    Types: Cigarettes  . Smokeless tobacco: Never Used  Vaping Use  . Vaping Use: Never used  Substance and Sexual Activity  . Alcohol use: Yes    Comment: occassionally  . Drug use: No  . Sexual activity: Yes    Birth control/protection: Condom  Other Topics Concern  . Not on file  Social History Narrative  . Not on file   Social Determinants of Health   Financial Resource Strain:   . Difficulty of Paying Living Expenses:   Food Insecurity:   . Worried About Programme researcher, broadcasting/film/video in the Last Year:   . Barista in the Last Year:   Transportation Needs:   . Freight forwarder (Medical):   Marland Kitchen Lack of Transportation (Non-Medical):   Physical Activity:   . Days of Exercise per Week:   . Minutes of Exercise per Session:   Stress:   . Feeling of Stress :  Social Connections:   . Frequency of Communication with Friends and Family:   . Frequency of Social Gatherings with Friends and Family:   . Attends Religious Services:   . Active Member of Clubs or Organizations:   . Attends Banker Meetings:   Marland Kitchen Marital Status:   Intimate Partner Violence:   . Fear of Current or Ex-Partner:   . Emotionally Abused:   Marland Kitchen Physically Abused:   . Sexually Abused:       Review of Systems  Constitutional: Negative for chills, fatigue and unexpected weight change.  HENT: Positive for ear pain, postnasal drip, sinus pressure and sinus pain. Negative for congestion, rhinorrhea, sneezing and sore throat.   Eyes: Negative for photophobia, pain and redness.   Respiratory: Negative for cough, chest tightness and shortness of breath.   Cardiovascular: Negative for chest pain and palpitations.  Gastrointestinal: Negative for abdominal pain, constipation, diarrhea, nausea and vomiting.  Endocrine: Negative.   Genitourinary: Negative for dysuria and frequency.  Musculoskeletal: Negative for arthralgias, back pain, joint swelling and neck pain.  Skin: Negative for rash.  Allergic/Immunologic: Negative.   Neurological: Negative for tremors and numbness.  Hematological: Negative for adenopathy. Does not bruise/bleed easily.  Psychiatric/Behavioral: Negative for behavioral problems and sleep disturbance. The patient is not nervous/anxious.     Vital Signs: Ht 5\' 3"  (1.6 m)   Wt 170 lb (77.1 kg)   BMI 30.11 kg/m    Observation/Objective:  Well sounding, NAD noted   Assessment/Plan: 1. Subacute frontal sinusitis Advised patient to take entire course of antibiotics as prescribed with food. Pt should return to clinic in 7-10 days if symptoms fail to improve or new symptoms develop.  - clarithromycin (BIAXIN) 500 MG tablet; Take 1 tablet (500 mg total) by mouth 2 (two) times daily.  Dispense: 14 tablet; Refill: 0  General Counseling: Nichole Boyd verbalizes understanding of the findings of today's phone visit and agrees with plan of treatment. I have discussed any further diagnostic evaluation that may be needed or ordered today. We also reviewed her medications today. she has been encouraged to call the office with any questions or concerns that should arise related to todays visit.    No orders of the defined types were placed in this encounter.   Meds ordered this encounter  Medications  . clarithromycin (BIAXIN) 500 MG tablet    Sig: Take 1 tablet (500 mg total) by mouth 2 (two) times daily.    Dispense:  14 tablet    Refill:  0    Time spent: 20 Minutes    9-10 AGNP-C Internal medicine

## 2019-08-07 ENCOUNTER — Ambulatory Visit: Payer: 59 | Admitting: Adult Health

## 2019-08-07 VITALS — BP 121/79 | Temp 101.7°F | Resp 16 | Ht 63.0 in

## 2019-08-07 DIAGNOSIS — R059 Cough, unspecified: Secondary | ICD-10-CM

## 2019-08-07 DIAGNOSIS — R05 Cough: Secondary | ICD-10-CM | POA: Diagnosis not present

## 2019-08-07 DIAGNOSIS — U071 COVID-19: Secondary | ICD-10-CM

## 2019-08-07 MED ORDER — AZITHROMYCIN 250 MG PO TABS: ORAL_TABLET | ORAL | 0 refills | Status: DC

## 2019-08-07 MED ORDER — GUAIFENESIN-CODEINE 100-10 MG/5ML PO SYRP: 5.0000 mL | ORAL_SOLUTION | Freq: Three times a day (TID) | ORAL | 0 refills | Status: DC | PRN

## 2019-08-07 MED ORDER — PREDNISONE 10 MG PO TABS: ORAL_TABLET | ORAL | 0 refills | Status: DC

## 2019-08-07 NOTE — Progress Notes (Signed)
[facility] [address] [place], [place] [address]  Internal MEDICINE  Telephone Visit  Patient Name: Nichole Boyd  [id number]  [id number]  Date of Service: 08/07/2019  I connected with the patient at 336 by telephone and verified the patients identity using two identifiers.   I discussed the limitations, risks, security and privacy concerns of performing an evaluation and management service by telephone and the availability of in person appointments. I also discussed with the patient that there may be a patient responsible charge related to the service.  The patient expressed understanding and agrees to proceed.    Chief Complaint  Patient presents with  . Telephone Screen    covid positive, fever  . Telephone Assessment  . Cough  . Back Pain  . Headache  . Generalized Body Aches    HPI  Pt is seen via telephone.  She reports she tested positive for covid yesterday after feeling bad since that morning. She is complaining of severe coughing, fever, body aches.  She also reports stabbing pain in her back with coughing.  She did not receive covid vaccines.    Current Medication: Outpatient Encounter Medications as of 08/07/2019  Medication Sig  . albuterol (VENTOLIN HFA) 108 (90 Base) MCG/ACT inhaler Inhale 2 puffs into the lungs every 6 (six) hours as needed for wheezing.  . clarithromycin (BIAXIN) 500 MG tablet Take 1 tablet (500 mg total) by mouth 2 (two) times daily.  . drospirenone-ethinyl estradiol (YAZ) 3-0.02 MG tablet Take 1 tablet by mouth daily.  . hydrOXYzine (ATARAX/VISTARIL) 50 MG tablet Take 1 tablet (50 mg total) by mouth 3 (three) times daily as needed for anxiety.  . Multiple Vitamin (MULTIVITAMIN ADULT PO) Take by mouth.  Marland Kitchen azithromycin (ZITHROMAX) 250 MG tablet Take as directed  . guaiFENesin-codeine (ROBITUSSIN AC) 100-10 MG/5ML syrup Take 5 mLs by mouth 3 (three) times daily as needed for cough.  . predniSONE (DELTASONE) 10 MG tablet Use per  dose pack   No facility-administered encounter medications on file as of 08/07/2019.    Surgical History: Past Surgical History:  Procedure Laterality Date  . HYSTEROSCOPY    . LAPAROSCOPY      Medical History: Past Medical History:  Diagnosis Date  . Allergies   . Asthma 02/02/2018  . Endometriosis   . Kidney infection     Family History: Family History  Problem Relation Age of Onset  . Diabetes Mother   . Heart attack Father     Social History   Socioeconomic History  . Marital status: Single    Spouse name: Not on file  . Number of children: Not on file  . Years of education: Not on file  . Highest education level: Not on file  Occupational History  . Not on file  Tobacco Use  . Smoking status: Former Smoker    Types: Cigarettes  . Smokeless tobacco: Never Used  Vaping Use  . Vaping Use: Never used  Substance and Sexual Activity  . Alcohol use: Yes    Comment: occassionally  . Drug use: No  . Sexual activity: Yes    Birth control/protection: Condom  Other Topics Concern  . Not on file  Social History Narrative  . Not on file   Social Determinants of Health   Financial Resource Strain:   . Difficulty of Paying Living Expenses:   Food Insecurity:   . Worried About Programme researcher, broadcasting/film/video in the Last Year:   . [employer]  in the Last Year:   Transportation Needs:   . Freight forwarder (Medical):   Marland Kitchen Lack of Transportation (Non-Medical):   Physical Activity:   . Days of Exercise per Week:   . Minutes of Exercise per Session:   Stress:   . Feeling of Stress :   Social Connections:   . Frequency of Communication with Friends and Family:   . Frequency of Social Gatherings with Friends and Family:   . Attends Religious Services:   . Active Member of Clubs or Organizations:   . Attends Banker Meetings:   Marland Kitchen Marital Status:   Intimate Partner Violence:   . Fear of Current or Ex-Partner:   . Emotionally Abused:   Marland Kitchen Physically  Abused:   . Sexually Abused:       Review of Systems  Constitutional: Negative for chills, fatigue and unexpected weight change.  HENT: Negative for congestion, rhinorrhea, sneezing and sore throat.   Eyes: Negative for photophobia, pain and redness.  Respiratory: Negative for cough, chest tightness and shortness of breath.   Cardiovascular: Negative for chest pain and palpitations.  Gastrointestinal: Negative for abdominal pain, constipation, diarrhea, nausea and vomiting.  Endocrine: Negative.   Genitourinary: Negative for dysuria and frequency.  Musculoskeletal: Negative for arthralgias, back pain, joint swelling and neck pain.  Skin: Negative for rash.  Allergic/Immunologic: Negative.   Neurological: Negative for tremors and numbness.  Hematological: Negative for adenopathy. Does not bruise/bleed easily.  Psychiatric/Behavioral: Negative for behavioral problems and sleep disturbance. The patient is not nervous/anxious.     Vital Signs: BP 121/79   Temp (!) 101.7 F (38.7 C)   Resp 16   Ht 5\' 3"  (1.6 m)   BMI 30.11 kg/m    Observation/Objective:  Ill sounding, NAD noted   Assessment/Plan: 1. COVID-19 *Advised patient to take entire course of antibiotics as prescribed with food. Pt should return to clinic in 7-10 days if symptoms fail to improve or new symptoms develop.  - azithromycin (ZITHROMAX) 250 MG tablet; Take as directed  Dispense: 6 tablet; Refill: 0 - predniSONE (DELTASONE) 10 MG tablet; Use per dose pack  Dispense: 21 tablet; Refill: 0  2. Cough Reviewed risks and possible side effects associated with taking opiates, benzodiazepines and other CNS depressants. Combination of these could cause dizziness and drowsiness. Advised patient not to drive or operate machinery when taking these medications, as patient's and other's life can be at risk and will have consequences. Patient verbalized understanding in this matter. Dependence and abuse for these drugs will  be monitored closely. A Controlled substance policy and procedure is on file which allows [facility] to order a urine drug screen test at any visit. Patient understands and agrees with the plan - guaiFENesin-codeine (ROBITUSSIN AC) 100-10 MG/5ML syrup; Take 5 mLs by mouth 3 (three) times daily as needed for cough.  Dispense: 70 mL; Refill: 0  General Counseling: Lavra verbalizes understanding of the findings of today's phone visit and agrees with plan of treatment. I have discussed any further diagnostic evaluation that may be needed or ordered today. We also reviewed her medications today. she has been encouraged to call the office with any questions or concerns that should arise related to todays visit.    No orders of the defined types were placed in this encounter.   Meds ordered this encounter  Medications  . guaiFENesin-codeine (ROBITUSSIN AC) 100-10 MG/5ML syrup    Sig: Take 5 mLs by mouth 3 (three) times daily as  needed for cough.    Dispense:  70 mL    Refill:  0  . azithromycin (ZITHROMAX) 250 MG tablet    Sig: Take as directed    Dispense:  6 tablet    Refill:  0  . predniSONE (DELTASONE) 10 MG tablet    Sig: Use per dose pack    Dispense:  21 tablet    Refill:  0    Time spent: 25 Minutes    Blima Ledger AGNP-C Internal medicine

## 2019-09-10 ENCOUNTER — Ambulatory Visit: Payer: 59 | Admitting: Hospice and Palliative Medicine

## 2019-09-10 ENCOUNTER — Encounter: Payer: Self-pay | Admitting: Hospice and Palliative Medicine

## 2019-09-10 DIAGNOSIS — J4520 Mild intermittent asthma, uncomplicated: Secondary | ICD-10-CM | POA: Diagnosis not present

## 2019-09-10 DIAGNOSIS — J029 Acute pharyngitis, unspecified: Secondary | ICD-10-CM | POA: Diagnosis not present

## 2019-09-10 MED ORDER — CLINDAMYCIN HCL 300 MG PO CAPS: 300.0000 mg | ORAL_CAPSULE | Freq: Two times a day (BID) | ORAL | 0 refills | Status: DC

## 2019-09-10 NOTE — Progress Notes (Signed)
[facility] [address] [place], [place] [address]  Internal MEDICINE  Telephone Visit  Patient Name: Nichole Boyd  [id number]  [id number]  Date of Service: 09/12/2019  I connected with the patient at 1530 by telephone and verified the patients identity using two identifiers.   I discussed the limitations, risks, security and privacy concerns of performing an evaluation and management service by telephone and the availability of in person appointments. I also discussed with the patient that there may be a patient responsible charge related to the service.  The patient expressed understanding and agrees to proceed.    Chief Complaint  Patient presents with  . Telephone Screen    telephone visit [id number]  . Telephone Assessment  . Sore Throat  . Headache    HPI Patient is here for a sick visit Sore throat started yesertday afternoon and has since progressed in severity as well as having a headache. She is also having chills and body aches. Denies sinus pressure or pain, congestion shortness of breath or cough. Unsure if she has been having fevers  Current Medication: Outpatient Encounter Medications as of 09/10/2019  Medication Sig  . albuterol (VENTOLIN HFA) 108 (90 Base) MCG/ACT inhaler Inhale 2 puffs into the lungs every 6 (six) hours as needed for wheezing.  . drospirenone-ethinyl estradiol (YAZ) 3-0.02 MG tablet Take 1 tablet by mouth daily.  Marland Kitchen guaiFENesin-codeine (ROBITUSSIN AC) 100-10 MG/5ML syrup Take 5 mLs by mouth 3 (three) times daily as needed for cough.  . hydrOXYzine (ATARAX/VISTARIL) 50 MG tablet Take 1 tablet (50 mg total) by mouth 3 (three) times daily as needed for anxiety.  . Multiple Vitamin (MULTIVITAMIN ADULT PO) Take by mouth.  . [DISCONTINUED] predniSONE (DELTASONE) 10 MG tablet Use per dose pack  . clindamycin (CLEOCIN) 300 MG capsule Take 1 capsule (300 mg total) by mouth in the morning and at bedtime.  . [DISCONTINUED] azithromycin  (ZITHROMAX) 250 MG tablet Take as directed (Patient not taking: Reported on 09/10/2019)  . [DISCONTINUED] clarithromycin (BIAXIN) 500 MG tablet Take 1 tablet (500 mg total) by mouth 2 (two) times daily. (Patient not taking: Reported on 09/10/2019)   No facility-administered encounter medications on file as of 09/10/2019.    Surgical History: Past Surgical History:  Procedure Laterality Date  . HYSTEROSCOPY    . LAPAROSCOPY      Medical History: Past Medical History:  Diagnosis Date  . Allergies   . Asthma 02/02/2018  . Endometriosis   . Kidney infection     Family History: Family History  Problem Relation Age of Onset  . Diabetes Mother   . Heart attack Father     Social History   Socioeconomic History  . Marital status: Single    Spouse name: Not on file  . Number of children: Not on file  . Years of education: Not on file  . Highest education level: Not on file  Occupational History  . Not on file  Tobacco Use  . Smoking status: Former Smoker    Types: Cigarettes  . Smokeless tobacco: Never Used  Vaping Use  . Vaping Use: Never used  Substance and Sexual Activity  . Alcohol use: Yes    Comment: occassionally  . Drug use: No  . Sexual activity: Yes    Birth control/protection: Condom  Other Topics Concern  . Not on file  Social History Narrative  . Not on file   Social Determinants of Health   Financial Resource Strain:   . Difficulty  of Paying Living Expenses: Not on file  Food Insecurity:   . Worried About Programme researcher, broadcasting/film/video in the Last Year: Not on file  . Ran Out of Food in the Last Year: Not on file  Transportation Needs:   . Lack of Transportation (Medical): Not on file  . Lack of Transportation (Non-Medical): Not on file  Physical Activity:   . Days of Exercise per Week: Not on file  . Minutes of Exercise per Session: Not on file  Stress:   . Feeling of Stress : Not on file  Social Connections:   . Frequency of Communication with Friends and  Family: Not on file  . Frequency of Social Gatherings with Friends and Family: Not on file  . Attends Religious Services: Not on file  . Active Member of Clubs or Organizations: Not on file  . Attends Banker Meetings: Not on file  . Marital Status: Not on file  Intimate Partner Violence:   . Fear of Current or Ex-Partner: Not on file  . Emotionally Abused: Not on file  . Physically Abused: Not on file  . Sexually Abused: Not on file   Review of Systems  Constitutional: Positive for chills and fatigue. Negative for diaphoresis.  HENT: Positive for sore throat, trouble swallowing and voice change. Negative for congestion, ear pain, postnasal drip, rhinorrhea, sinus pressure and sinus pain.   Eyes: Negative for photophobia, discharge, redness, itching and visual disturbance.  Respiratory: Negative for cough, shortness of breath and wheezing.   Cardiovascular: Negative for chest pain, palpitations and leg swelling.  Gastrointestinal: Negative for abdominal pain, constipation, diarrhea, nausea and vomiting.  Genitourinary: Negative for dysuria and flank pain.  Musculoskeletal: Negative for arthralgias, back pain, gait problem and neck pain.  Skin: Negative for color change.  Allergic/Immunologic: Negative for environmental allergies and food allergies.  Neurological: Positive for headaches. Negative for dizziness, tremors and weakness.  Hematological: Does not bruise/bleed easily.  Psychiatric/Behavioral: Negative for agitation, behavioral problems (depression) and hallucinations.    Vital Signs: Ht 5\' 3"  (1.6 m)   Wt 170 lb (77.1 kg)   BMI 30.11 kg/m    Observation/Objective: No acute distress noted  Assessment/Plan: 1. Acute pharyngitis, unspecified etiology Will treat with clindamycin due to amoxicillin allergy. Encouraged her to contact office if symptoms worsen or have not improved within 10 days. Requesting work note to be excused from work, will have not ready  for her to pick up. - clindamycin (CLEOCIN) 300 MG capsule; Take 1 capsule (300 mg total) by mouth in the morning and at bedtime.  Dispense: 10 capsule; Refill: 0  2. Mild intermittent asthma, unspecified whether complicated At this time symptoms remain stable, encouraged to use albuterol inhaler as needed during acute illness. Will continue with routine monitoring.  General Counseling: Elica verbalizes understanding of the findings of today's phone visit and agrees with plan of treatment. I have discussed any further diagnostic evaluation that may be needed or ordered today. We also reviewed her medications today. she has been encouraged to call the office with any questions or concerns that should arise related to todays visit.    Meds ordered this encounter  Medications  . clindamycin (CLEOCIN) 300 MG capsule    Sig: Take 1 capsule (300 mg total) by mouth in the morning and at bedtime.    Dispense:  10 capsule    Refill:  0     Time spent: 20 Minutes Time spent includes review of chart, medications, test results  and follow-up plan with the patient.  Lubertha Basque Leaira Fullam AGNP-C Internal medicine

## 2019-09-12 ENCOUNTER — Encounter: Payer: Self-pay | Admitting: Hospice and Palliative Medicine

## 2019-10-06 ENCOUNTER — Other Ambulatory Visit: Payer: Self-pay

## 2019-10-06 DIAGNOSIS — Z30011 Encounter for initial prescription of contraceptive pills: Secondary | ICD-10-CM

## 2019-10-06 MED ORDER — DROSPIRENONE-ETHINYL ESTRADIOL 3-0.02 MG PO TABS: 1.0000 | ORAL_TABLET | Freq: Every day | ORAL | 0 refills | Status: DC

## 2019-12-23 ENCOUNTER — Ambulatory Visit: Payer: 59 | Admitting: Nurse Practitioner

## 2019-12-23 VITALS — BP 120/87 | HR 102 | Temp 98.3°F | Resp 16 | Ht 63.0 in | Wt 179.4 lb

## 2019-12-23 DIAGNOSIS — R7301 Impaired fasting glucose: Secondary | ICD-10-CM

## 2019-12-23 DIAGNOSIS — G43009 Migraine without aura, not intractable, without status migrainosus: Secondary | ICD-10-CM

## 2019-12-23 DIAGNOSIS — R5383 Other fatigue: Secondary | ICD-10-CM | POA: Diagnosis not present

## 2019-12-24 ENCOUNTER — Encounter: Payer: Self-pay | Admitting: Nurse Practitioner

## 2020-01-05 ENCOUNTER — Ambulatory Visit: Payer: 59 | Admitting: Hospice and Palliative Medicine

## 2020-01-08 ENCOUNTER — Telehealth: Payer: Self-pay

## 2020-01-08 NOTE — Telephone Encounter (Signed)
Lmom to rs missed ov from 01-05-2020. Toni Amend

## 2020-01-13 ENCOUNTER — Other Ambulatory Visit: Payer: Self-pay

## 2020-01-13 DIAGNOSIS — Z30011 Encounter for initial prescription of contraceptive pills: Secondary | ICD-10-CM

## 2020-01-13 MED ORDER — DROSPIRENONE-ETHINYL ESTRADIOL 3-0.02 MG PO TABS: 1.0000 | ORAL_TABLET | Freq: Every day | ORAL | 3 refills | Status: DC

## 2020-01-21 DIAGNOSIS — R7301 Impaired fasting glucose: Secondary | ICD-10-CM | POA: Insufficient documentation

## 2020-01-21 DIAGNOSIS — G43009 Migraine without aura, not intractable, without status migrainosus: Secondary | ICD-10-CM | POA: Insufficient documentation

## 2020-01-21 NOTE — Progress Notes (Signed)
[facility] [address] [place], [place] [address]  Internal MEDICINE  Office Visit Note  Patient Name: Nichole Boyd  [id number]  [id number]  Date of Service: 01/21/2020  Chief Complaint  Patient presents with  . Follow-up  . Asthma    The patient is here for routine follow up. States that she had COVID 19 in august. Since then, she has lingering headaches. They last for several days. They don't go away with conventional treatments such as tylenol and/or ibuprofen. She has severe fatigue.  -history of migraine headaches, off and on throughout her life.  -has checked her blood sugar a few times when she was feeling poorly. Blood sugar might be 130 in the morning, prior to eating anything. cann be >200 four hours after eating.  -denies chest pain, chest pressure, or shortness of breath -denies nausea, vomiting, or diarrhea.  -has noted increased anxiety since this has all started. She feels as though anxiety is associated with higher blood sugars.       Current Medication: Outpatient Encounter Medications as of 12/23/2019  Medication Sig  . albuterol (VENTOLIN HFA) 108 (90 Base) MCG/ACT inhaler Inhale 2 puffs into the lungs every 6 (six) hours as needed for wheezing.  . clindamycin (CLEOCIN) 300 MG capsule Take 1 capsule (300 mg total) by mouth in the morning and at bedtime.  . hydrOXYzine (ATARAX/VISTARIL) 50 MG tablet Take 1 tablet (50 mg total) by mouth 3 (three) times daily as needed for anxiety.  . Multiple Vitamin (MULTIVITAMIN ADULT PO) Take by mouth.  . [DISCONTINUED] drospirenone-ethinyl estradiol (YAZ) 3-0.02 MG tablet Take 1 tablet by mouth daily.  Marland Kitchen guaiFENesin-codeine (ROBITUSSIN AC) 100-10 MG/5ML syrup Take 5 mLs by mouth 3 (three) times daily as needed for cough. (Patient not taking: Reported on 12/24/2019)   No facility-administered encounter medications on file as of 12/23/2019.    Surgical History: Past Surgical History:  Procedure Laterality  Date  . HYSTEROSCOPY    . LAPAROSCOPY      Medical History: Past Medical History:  Diagnosis Date  . Allergies   . Asthma 02/02/2018  . Endometriosis   . Kidney infection     Family History: Family History  Problem Relation Age of Onset  . Diabetes Mother   . Heart attack Father     Social History   Socioeconomic History  . Marital status: Single    Spouse name: Not on file  . Number of children: Not on file  . Years of education: Not on file  . Highest education level: Not on file  Occupational History  . Not on file  Tobacco Use  . Smoking status: Former Smoker    Types: Cigarettes  . Smokeless tobacco: Never Used  Vaping Use  . Vaping Use: Never used  Substance and Sexual Activity  . Alcohol use: Yes    Comment: occassionally  . Drug use: No  . Sexual activity: Yes    Birth control/protection: Condom  Other Topics Concern  . Not on file  Social History Narrative  . Not on file   Social Determinants of Health   Financial Resource Strain: Not on file  Food Insecurity: Not on file  Transportation Needs: Not on file  Physical Activity: Not on file  Stress: Not on file  Social Connections: Not on file  Intimate Partner Violence: Not on file      Review of Systems  Constitutional: Positive for fatigue. Negative for chills and unexpected weight change.  HENT: Negative for  congestion, postnasal drip, rhinorrhea, sneezing and sore throat.   Respiratory: Negative for cough, chest tightness, shortness of breath and wheezing.   Cardiovascular: Negative for chest pain and palpitations.  Gastrointestinal: Negative for abdominal pain, constipation, diarrhea, nausea and vomiting.  Endocrine: Positive for polydipsia and polyuria. Negative for cold intolerance and heat intolerance.       Has been checking blood sugars intermittently. Can run between mid 100s and low 200s.   Musculoskeletal: Negative for arthralgias, back pain, joint swelling and neck pain.   Skin: Negative for rash.  Neurological: Positive for headaches. Negative for tremors and numbness.  Hematological: Negative for adenopathy. Does not bruise/bleed easily.  Psychiatric/Behavioral: Negative for behavioral problems (Depression), sleep disturbance and suicidal ideas. The patient is nervous/anxious.     Today's Vitals   12/24/19 1612  BP: 120/87  Pulse: (!) 102  Resp: 16  Temp: 98.3 F (36.8 C)  SpO2: 98%  Weight: 179 lb 6.4 oz (81.4 kg)  Height: 5\' 3"  (1.6 m)   Body mass index is 31.78 kg/m.  Physical Exam Vitals and nursing note reviewed.  Constitutional:      General: She is not in acute distress.    Appearance: Normal appearance. She is well-developed and well-nourished. She is not diaphoretic.  HENT:     Head: Normocephalic and atraumatic.     Right Ear: External ear normal.     Left Ear: External ear normal.     Nose: Nose normal.     Mouth/Throat:     Mouth: Oropharynx is clear and moist.     Pharynx: No oropharyngeal exudate.  Eyes:     Extraocular Movements: EOM normal.     Pupils: Pupils are equal, round, and reactive to light.  Neck:     Thyroid: No thyromegaly.     Vascular: No JVD.     Trachea: No tracheal deviation.  Cardiovascular:     Rate and Rhythm: Normal rate and regular rhythm.     Heart sounds: Normal heart sounds. No murmur heard. No friction rub. No gallop.   Pulmonary:     Effort: Pulmonary effort is normal. No respiratory distress.     Breath sounds: Normal breath sounds. No wheezing or rales.  Chest:     Chest wall: No tenderness.  Abdominal:     Palpations: Abdomen is soft.  Musculoskeletal:        General: Normal range of motion.     Cervical back: Normal range of motion and neck supple.  Lymphadenopathy:     Cervical: No cervical adenopathy.  Skin:    General: Skin is warm and dry.  Neurological:     General: No focal deficit present.     Mental Status: She is alert and oriented to person, place, and time.      Cranial Nerves: No cranial nerve deficit.  Psychiatric:        Mood and Affect: Mood and affect and mood normal.        Behavior: Behavior normal.        Thought Content: Thought content normal.        Judgment: Judgment normal.    Assessment/Plan: 1. Other fatigue Lab slip given to check anemia panel, thyroid panel, and vitamin d level   2. Migraine without aura and without status migrainosus, not intractable Trial of nurtec 75mg  when needed for acute migraine. Samples provided today.   3. Impaired fasting glucose Check BMP and HgbA1c.treat as indicated.   General Counseling: Tahtiana verbalizes understanding  of the findings of todays visit and agrees with plan of treatment. I have discussed any further diagnostic evaluation that may be needed or ordered today. We also reviewed her medications today. she has been encouraged to call the office with any questions or concerns that should arise related to todays visit.   This patient was seen by Vincent Gros FNP Collaboration with Dr Lyndon Code as a part of collaborative care agreement  Total time spent: 30 Minutes   Time spent includes review of chart, medications, test results, and follow up plan with the patient.      Dr Lyndon Code Internal medicine

## 2020-01-30 ENCOUNTER — Telehealth: Payer: Self-pay

## 2020-01-30 ENCOUNTER — Other Ambulatory Visit: Payer: Self-pay

## 2020-01-30 LAB — BASIC METABOLIC PANEL: Glucose: 92

## 2020-01-30 MED ORDER — PROMETHAZINE HCL 12.5 MG PO TABS: 12.5000 mg | ORAL_TABLET | Freq: Three times a day (TID) | ORAL | 0 refills | Status: DC | PRN

## 2020-01-30 NOTE — Telephone Encounter (Signed)
Pt called that she having pain in her right side under ribs and back pain ,nausea and stomach pain as per dr Welton Flakes advised her to we send her promethazine for nausea and take omeprazole 20 mg twice a day and take Tylenol for pain and if symptoms gets worse then go to ED otherwise call us back Monday

## 2020-02-24 ENCOUNTER — Other Ambulatory Visit: Payer: Self-pay

## 2020-02-24 ENCOUNTER — Ambulatory Visit: Payer: BC Managed Care – PPO | Admitting: Hospice and Palliative Medicine

## 2020-02-24 ENCOUNTER — Encounter: Payer: Self-pay | Admitting: Hospice and Palliative Medicine

## 2020-02-24 VITALS — BP 141/85 | Temp 100.5°F | Resp 16 | Ht 63.0 in

## 2020-02-24 DIAGNOSIS — J4521 Mild intermittent asthma with (acute) exacerbation: Secondary | ICD-10-CM

## 2020-02-24 DIAGNOSIS — J0140 Acute pansinusitis, unspecified: Secondary | ICD-10-CM

## 2020-02-24 DIAGNOSIS — Z1152 Encounter for screening for COVID-19: Secondary | ICD-10-CM

## 2020-02-24 LAB — POC SOFIA 2 FLU + SARS ANTIGEN FIA: SARS Coronavirus 2 Ag: NEGATIVE

## 2020-02-24 LAB — POCT INFLUENZA A/B
Influenza A, POC: NEGATIVE
Influenza B, POC: NEGATIVE

## 2020-02-24 MED ORDER — BENZONATATE 100 MG PO CAPS: 100.0000 mg | ORAL_CAPSULE | Freq: Two times a day (BID) | ORAL | 0 refills | Status: DC | PRN

## 2020-02-24 MED ORDER — AZITHROMYCIN 250 MG PO TABS: ORAL_TABLET | ORAL | 0 refills | Status: DC

## 2020-02-24 MED ORDER — PREDNISONE 10 MG PO TABS
ORAL_TABLET | ORAL | 0 refills | Status: DC
Start: 2020-02-24 — End: 2021-10-25

## 2020-02-24 NOTE — Progress Notes (Signed)
[facility] [address] [place], [place] [address]  Internal MEDICINE  Telephone Visit  Patient Name: Nichole Boyd  [id number]  [id number]  Date of Service: 02/24/2020  I connected with the patient at 1341 by telephone and verified the patients identity using two identifiers.   I discussed the limitations, risks, security and privacy concerns of performing an evaluation and management service by telephone and the availability of in person appointments. I also discussed with the patient that there may be a patient responsible charge related to the service.  The patient expressed understanding and agrees to proceed.    Chief Complaint  Patient presents with  . telephone assesment  . Telephone Screen       . Cough  . Asthma  . Generalized Body Aches  . Fatigue    HPI Patient being seen virtually today for acute sick visit Started feeling bad on Friday with body aches, Saturday she developed a sore throat--continued body aches, felt some better Sunday and went to work Monday-that evening she developed chest congestion, coughing, fevers and wheezing History of asthma Has been using albuterol inhaler for wheezing which helps relieve her symptoms Taking Dayquil OTC to help with symptoms  Current Medication: Outpatient Encounter Medications as of 02/24/2020  Medication Sig  . azithromycin (ZITHROMAX) 250 MG tablet Take one tab po qd for 10 days  . benzonatate (TESSALON) 100 MG capsule Take 1 capsule (100 mg total) by mouth 2 (two) times daily as needed for cough.  . predniSONE (DELTASONE) 10 MG tablet Take 1 tablet three times a day with a meal for three for three days, take 1 tablet by twice daily with a meal for 3 days, take 1 tablet once daily with a meal for 3 days  . albuterol (VENTOLIN HFA) 108 (90 Base) MCG/ACT inhaler Inhale 2 puffs into the lungs every 6 (six) hours as needed for wheezing.  . clindamycin (CLEOCIN) 300 MG capsule Take 1 capsule (300 mg total) by  mouth in the morning and at bedtime.  . drospirenone-ethinyl estradiol (YAZ) 3-0.02 MG tablet Take 1 tablet by mouth daily.  Marland Kitchen guaiFENesin-codeine (ROBITUSSIN AC) 100-10 MG/5ML syrup Take 5 mLs by mouth 3 (three) times daily as needed for cough. (Patient not taking: Reported on 12/24/2019)  . hydrOXYzine (ATARAX/VISTARIL) 50 MG tablet Take 1 tablet (50 mg total) by mouth 3 (three) times daily as needed for anxiety.  . Multiple Vitamin (MULTIVITAMIN ADULT PO) Take by mouth.  . promethazine (PHENERGAN) 12.5 MG tablet Take 1 tablet (12.5 mg total) by mouth every 8 (eight) hours as needed for nausea or vomiting.   No facility-administered encounter medications on file as of 02/24/2020.    Surgical History: Past Surgical History:  Procedure Laterality Date  . HYSTEROSCOPY    . LAPAROSCOPY      Medical History: Past Medical History:  Diagnosis Date  . Allergies   . Asthma 02/02/2018  . Endometriosis   . Kidney infection     Family History: Family History  Problem Relation Age of Onset  . Diabetes Mother   . Heart attack Father     Social History   Socioeconomic History  . Marital status: Single    Spouse name: Not on file  . Number of children: Not on file  . Years of education: Not on file  . Highest education level: Not on file  Occupational History  . Not on file  Tobacco Use  . Smoking status: Former Smoker    Types:  Cigarettes  . Smokeless tobacco: Never Used  Vaping Use  . Vaping Use: Never used  Substance and Sexual Activity  . Alcohol use: Yes    Comment: occassionally  . Drug use: No  . Sexual activity: Yes    Birth control/protection: Condom  Other Topics Concern  . Not on file  Social History Narrative  . Not on file   Social Determinants of Health   Financial Resource Strain: Not on file  Food Insecurity: Not on file  Transportation Needs: Not on file  Physical Activity: Not on file  Stress: Not on file  Social Connections: Not on file   Intimate Partner Violence: Not on file      Review of Systems  Constitutional: Positive for fatigue. Negative for chills and diaphoresis.  HENT: Positive for sinus pressure, sinus pain and sore throat. Negative for ear pain and postnasal drip.   Eyes: Negative for photophobia, discharge, redness, itching and visual disturbance.  Respiratory: Positive for cough, shortness of breath and wheezing.   Cardiovascular: Negative for chest pain, palpitations and leg swelling.  Gastrointestinal: Negative for abdominal pain, constipation, diarrhea, nausea and vomiting.  Genitourinary: Negative for dysuria and flank pain.  Musculoskeletal: Negative for arthralgias, back pain, gait problem and neck pain.  Skin: Negative for color change.  Allergic/Immunologic: Negative for environmental allergies and food allergies.  Neurological: Negative for dizziness and headaches.  Hematological: Does not bruise/bleed easily.  Psychiatric/Behavioral: Negative for agitation, behavioral problems (depression) and hallucinations.    Vital Signs: BP (!) 141/85   Temp (!) 100.5 F (38.1 C)   Resp 16   Ht 5\' 3"  (1.6 m)   BMI 31.78 kg/m    Observation/Objective: Come to office for COVID and influenza testing Lungs clear throughout, no rhonchi or wheezing Ill--appearing  Assessment/Plan: 1. Acute non-recurrent pansinusitis Influenza testing negative Treat with extended course of azithromycin and benzonatate Will follow-up by the end of this week and send for CXR if symptoms not improving - POCT Influenza A/B - azithromycin (ZITHROMAX) 250 MG tablet; Take one tab po qd for 10 days  Dispense: 10 tablet; Refill: 0 - benzonatate (TESSALON) 100 MG capsule; Take 1 capsule (100 mg total) by mouth 2 (two) times daily as needed for cough.  Dispense: 20 capsule; Refill: 0  2. Mild intermittent asthma with acute exacerbation Exacerbation due to acute illness Short course prednisone taper Continue with albuterol  as needed - predniSONE (DELTASONE) 10 MG tablet; Take 1 tablet three times a day with a meal for three for three days, take 1 tablet by twice daily with a meal for 3 days, take 1 tablet once daily with a meal for 3 days  Dispense: 18 tablet; Refill: 0  3. Encounter for screening for COVID-19 Negative COVID testing - POC SOFIA 2 FLU + SARS ANTIGEN FIA  General Counseling: Nuvia verbalizes understanding of the findings of today's phone visit and agrees with plan of treatment. I have discussed any further diagnostic evaluation that may be needed or ordered today. We also reviewed her medications today. she has been encouraged to call the office with any questions or concerns that should arise related to todays visit.    Orders Placed This Encounter  Procedures  . POC SOFIA 2 FLU + SARS ANTIGEN FIA  . POCT Influenza A/B    Meds ordered this encounter  Medications  . azithromycin (ZITHROMAX) 250 MG tablet    Sig: Take one tab po qd for 10 days    Dispense:  10  tablet    Refill:  0  . predniSONE (DELTASONE) 10 MG tablet    Sig: Take 1 tablet three times a day with a meal for three for three days, take 1 tablet by twice daily with a meal for 3 days, take 1 tablet once daily with a meal for 3 days    Dispense:  18 tablet    Refill:  0  . benzonatate (TESSALON) 100 MG capsule    Sig: Take 1 capsule (100 mg total) by mouth 2 (two) times daily as needed for cough.    Dispense:  20 capsule    Refill:  0     Time spent: 30 Minutes Time spent includes review of chart, medications, test results and follow-up plan with the patient.  Lubertha Basque Lonie Rummell AGNP-C Internal medicine

## 2020-05-13 ENCOUNTER — Other Ambulatory Visit: Payer: Self-pay | Admitting: Hospice and Palliative Medicine

## 2020-05-13 DIAGNOSIS — Z30011 Encounter for initial prescription of contraceptive pills: Secondary | ICD-10-CM

## 2020-06-24 ENCOUNTER — Other Ambulatory Visit: Payer: BC Managed Care – PPO | Admitting: Nurse Practitioner

## 2020-07-06 ENCOUNTER — Telehealth: Payer: Self-pay

## 2020-07-06 NOTE — Telephone Encounter (Signed)
Left vm to reschedule 06/24/20 missed appointment-Toni

## 2020-07-12 ENCOUNTER — Telehealth: Payer: Self-pay

## 2020-07-12 NOTE — Telephone Encounter (Signed)
Left vm to reschedule 06/24/20 missed appointment-Toni 

## 2020-08-08 ENCOUNTER — Encounter: Payer: Self-pay | Admitting: Internal Medicine

## 2020-09-22 ENCOUNTER — Encounter: Payer: Self-pay | Admitting: General Surgery

## 2020-10-28 ENCOUNTER — Other Ambulatory Visit: Payer: Self-pay | Admitting: Internal Medicine

## 2020-10-28 DIAGNOSIS — Z30011 Encounter for initial prescription of contraceptive pills: Secondary | ICD-10-CM

## 2020-11-17 IMAGING — CR DG CHEST 2V
2 series · 2 of 2 positions shown · non-contrast
Comparison: 01/16/2012 and earlier.

CLINICAL DATA: Four week history of cough.

EXAM:
CHEST - 2 VIEW

[chest pa]
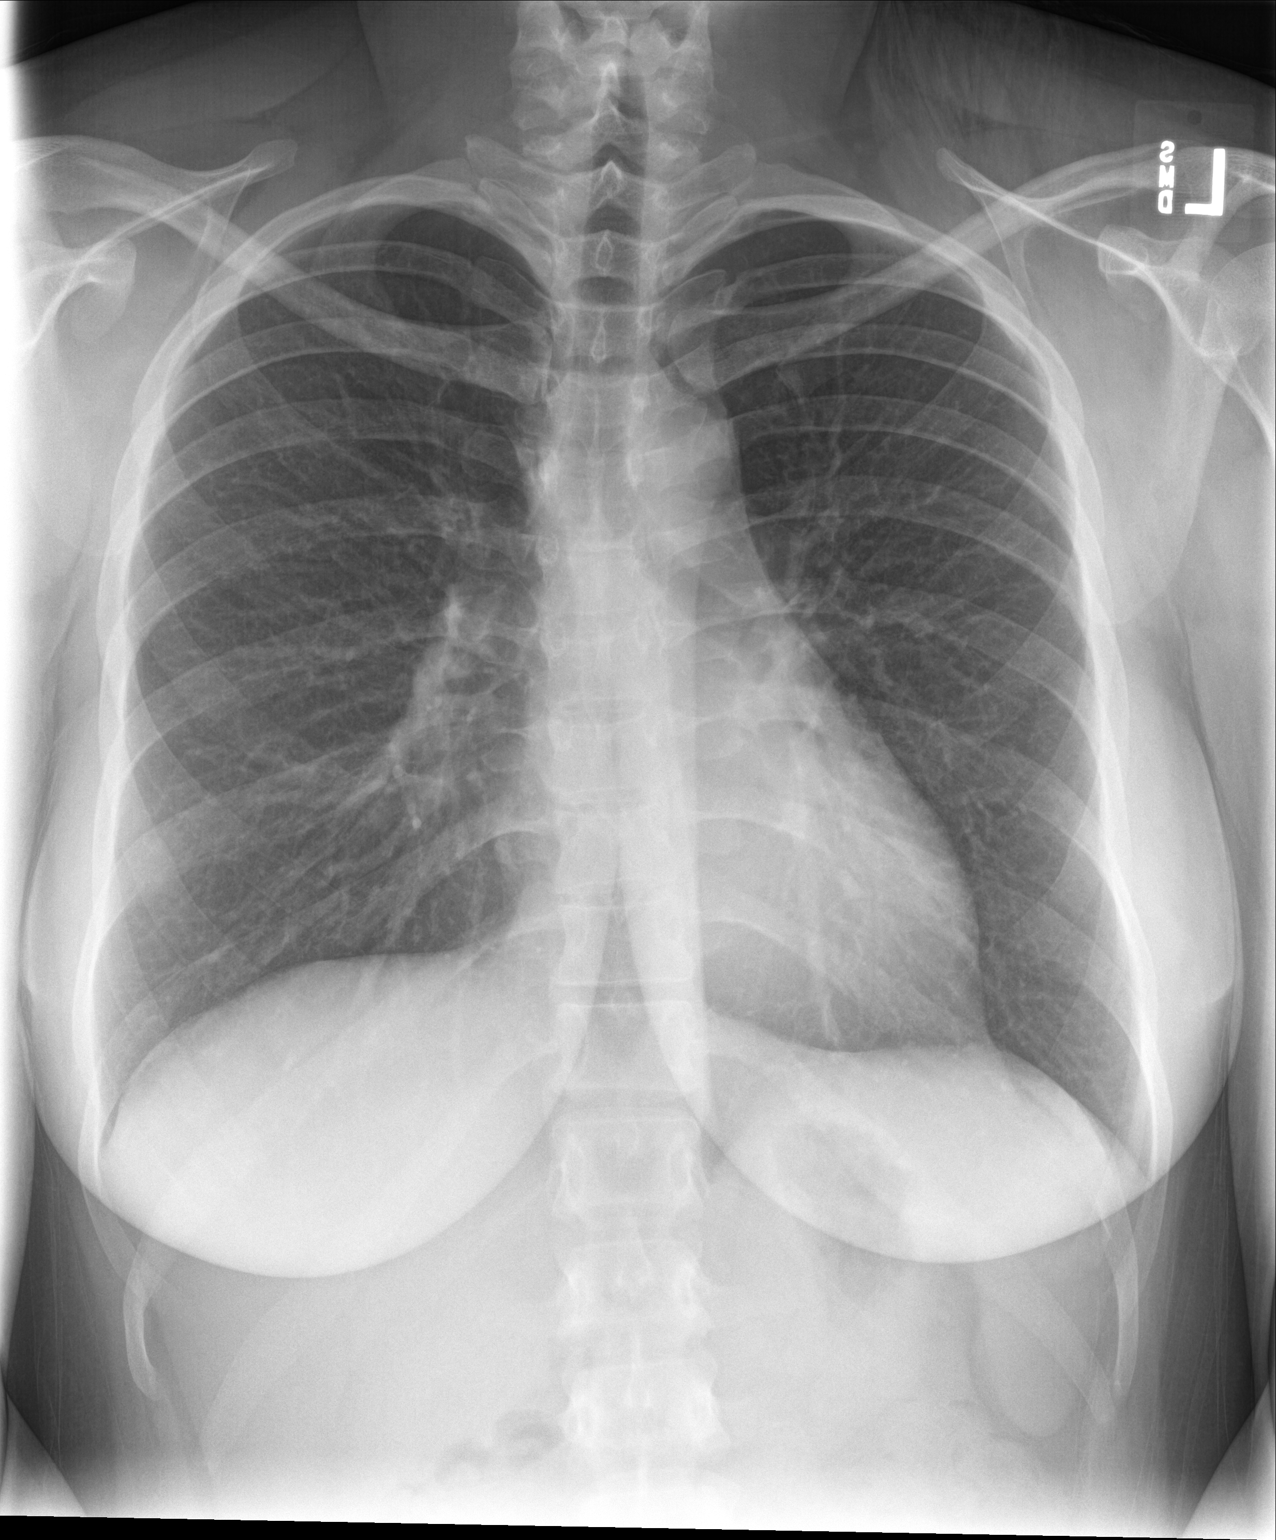

[chest lat]
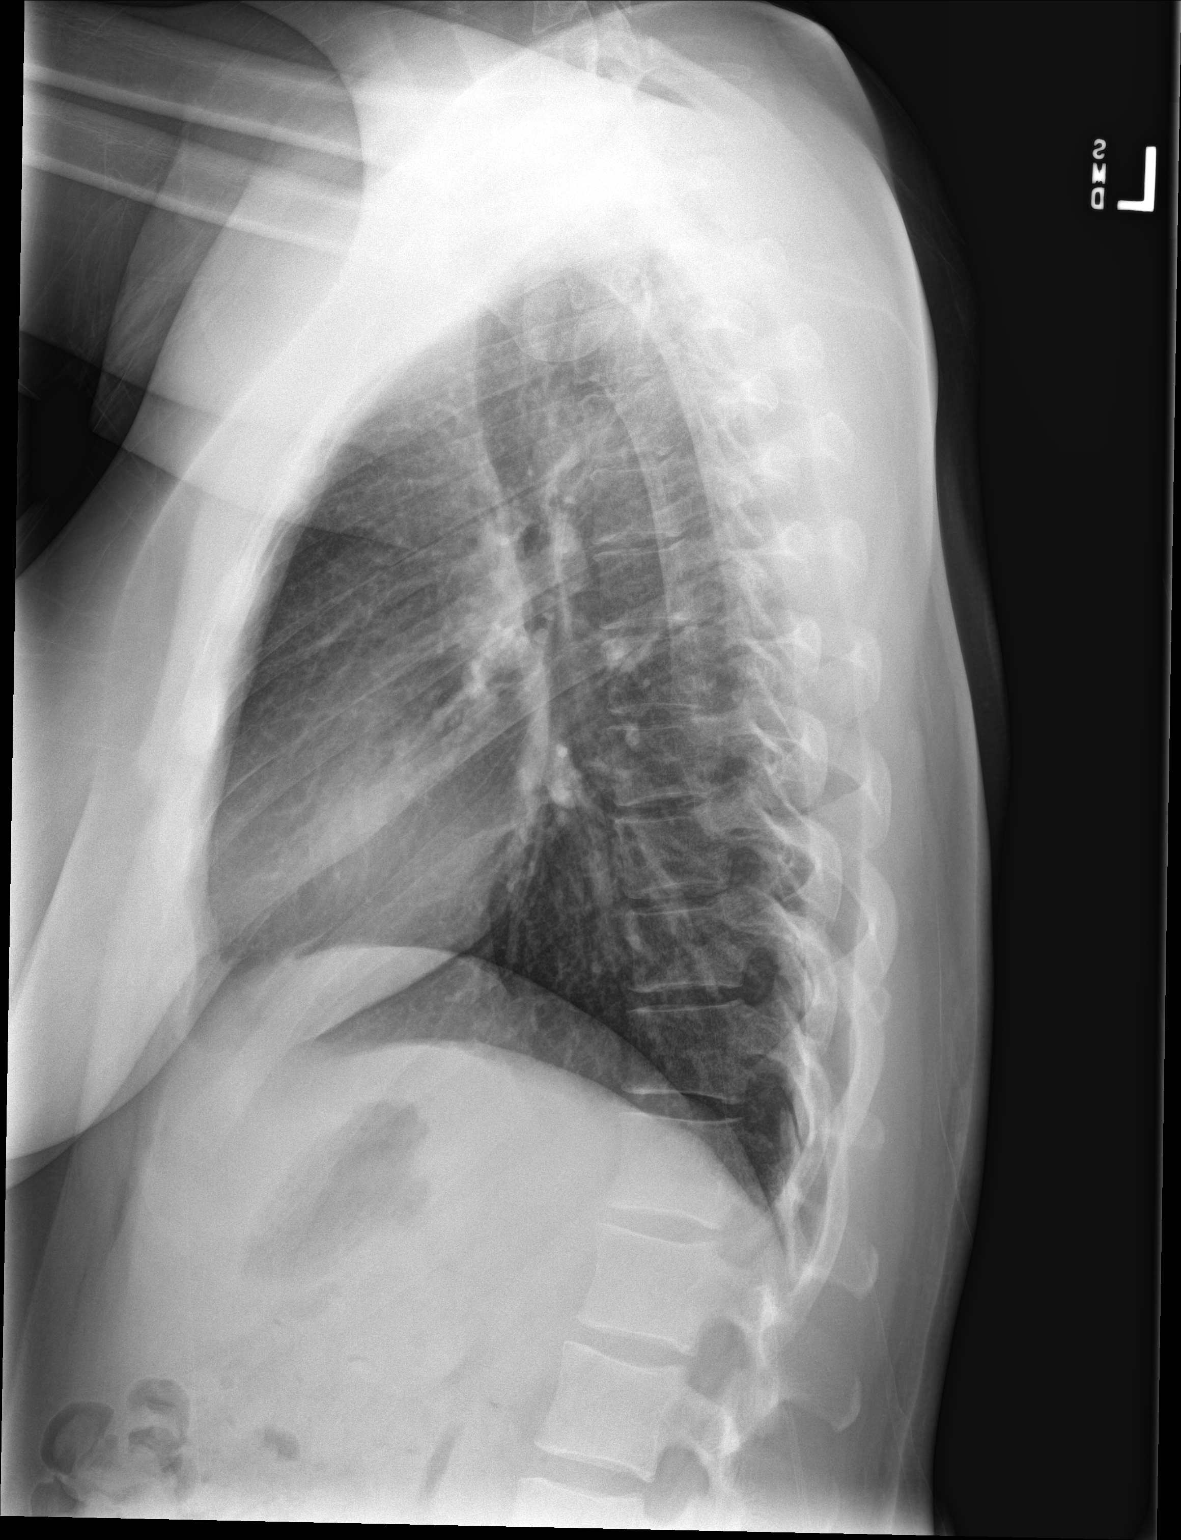

[2 of 2 positions shown; findings below may reference images not displayed]

FINDINGS: Cardiomediastinal silhouette unremarkable and unchanged. Lungs
clear. Bronchovascular markings normal. Pulmonary vascularity
normal. No visible pleural effusions. No pneumothorax. Visualized
bony thorax intact.
IMPRESSION: No acute cardiopulmonary disease.  Stable examination.

## 2021-02-06 IMAGING — DX PORTABLE CHEST - 1 VIEW
1 series · 1 of 1 positions shown · non-contrast
Comparison: 02/17/2018

CLINICAL DATA: Chest tightness.

EXAM:
PORTABLE CHEST 1 VIEW

[chest ap]
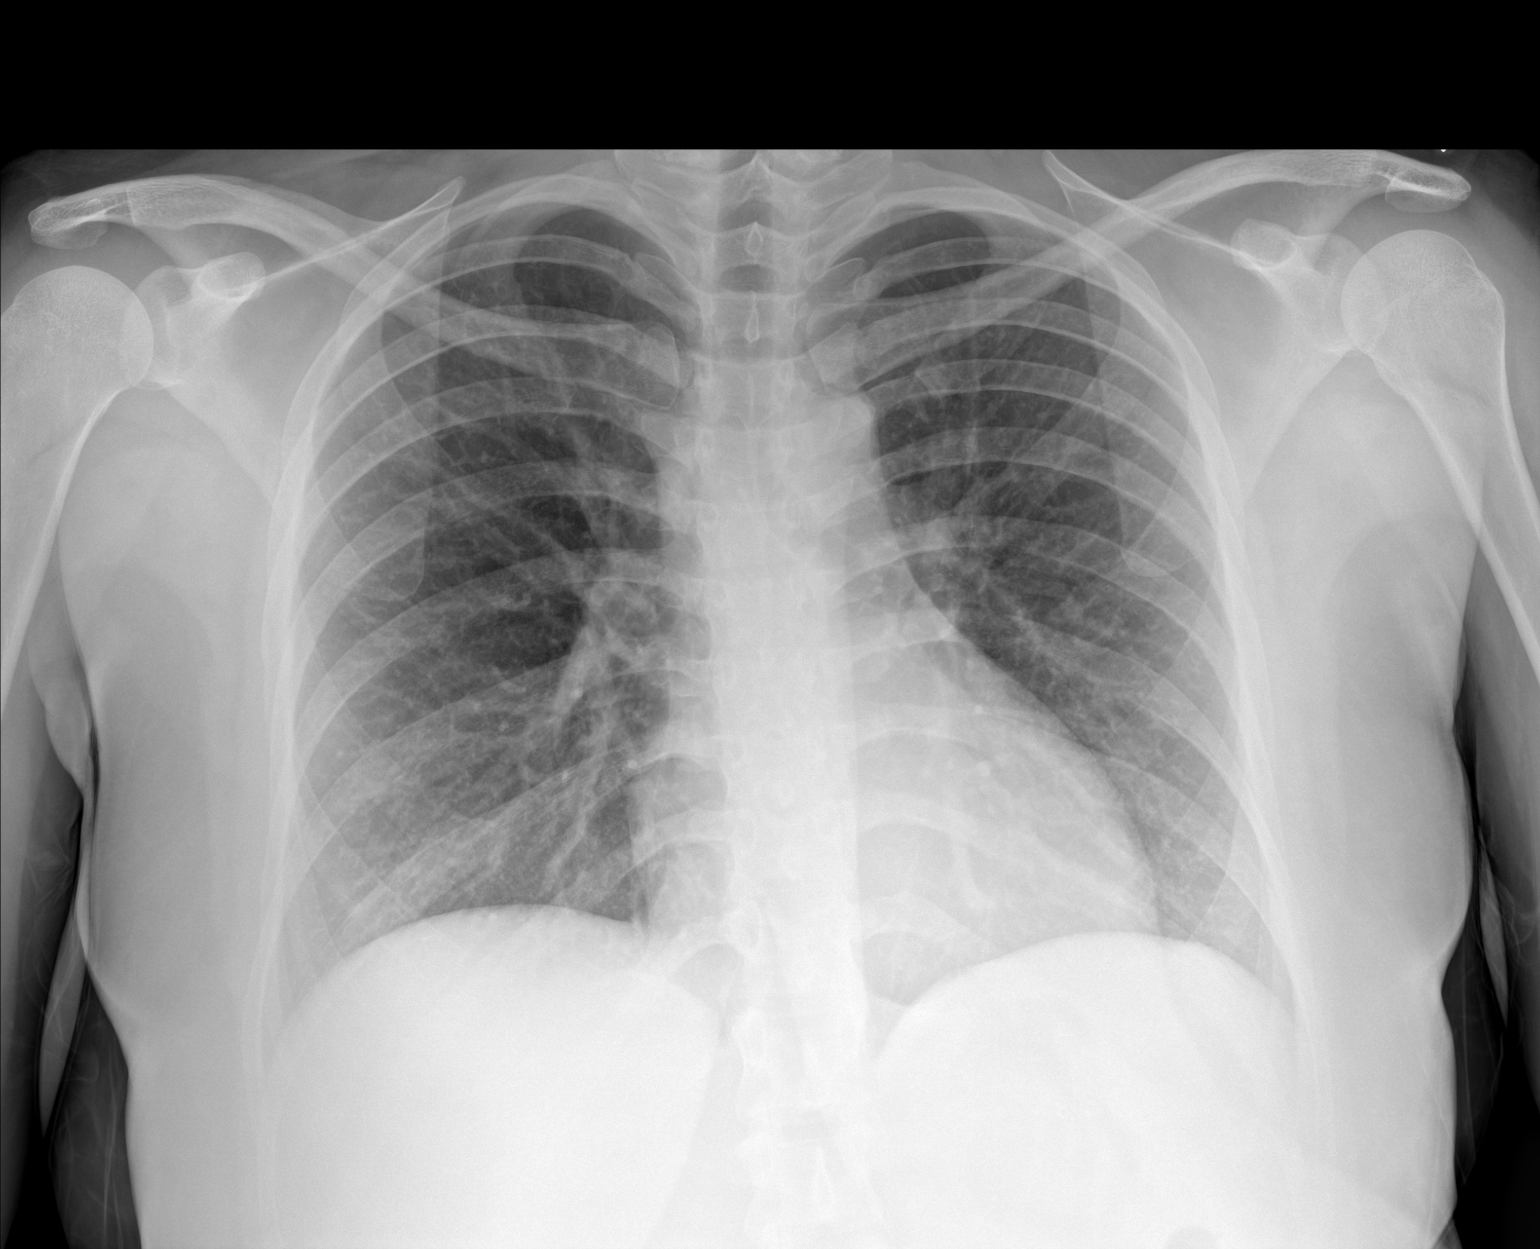

[1 of 1 positions shown; findings below may reference images not displayed]

FINDINGS: The heart size and mediastinal contours are within normal limits.
There is no evidence of pulmonary edema, consolidation,
pneumothorax, nodule or pleural fluid. The visualized skeletal
structures are unremarkable.
IMPRESSION: No active disease.

## 2021-08-16 ENCOUNTER — Ambulatory Visit: Payer: BC Managed Care – PPO | Admitting: Nurse Practitioner

## 2021-10-25 ENCOUNTER — Emergency Department: Payer: BC Managed Care – PPO

## 2021-10-25 ENCOUNTER — Emergency Department
Admission: EM | Admit: 2021-10-25 | Discharge: 2021-10-25 | Disposition: A | Discharge status: Home / Self Care | Payer: BC Managed Care – PPO | Attending: Emergency Medicine | Admitting: Emergency Medicine

## 2021-10-25 ENCOUNTER — Other Ambulatory Visit: Payer: Self-pay

## 2021-10-25 DIAGNOSIS — Z3A01 Less than 8 weeks gestation of pregnancy: Secondary | ICD-10-CM | POA: Diagnosis not present

## 2021-10-25 DIAGNOSIS — R519 Headache, unspecified: Secondary | ICD-10-CM | POA: Diagnosis not present

## 2021-10-25 DIAGNOSIS — H6991 Unspecified Eustachian tube disorder, right ear: Secondary | ICD-10-CM

## 2021-10-25 DIAGNOSIS — O23511 Infections of cervix in pregnancy, first trimester: Secondary | ICD-10-CM | POA: Diagnosis not present

## 2021-10-25 DIAGNOSIS — R059 Cough, unspecified: Secondary | ICD-10-CM | POA: Diagnosis not present

## 2021-10-25 DIAGNOSIS — R7309 Other abnormal glucose: Secondary | ICD-10-CM | POA: Insufficient documentation

## 2021-10-25 DIAGNOSIS — H6981 Other specified disorders of Eustachian tube, right ear: Secondary | ICD-10-CM | POA: Insufficient documentation

## 2021-10-25 DIAGNOSIS — O26891 Other specified pregnancy related conditions, first trimester: Secondary | ICD-10-CM | POA: Insufficient documentation

## 2021-10-25 DIAGNOSIS — M542 Cervicalgia: Secondary | ICD-10-CM

## 2021-10-25 DIAGNOSIS — R079 Chest pain, unspecified: Secondary | ICD-10-CM | POA: Diagnosis not present

## 2021-10-25 LAB — URINALYSIS, ROUTINE W REFLEX MICROSCOPIC
Bilirubin Urine: NEGATIVE
Glucose, UA: NEGATIVE mg/dL
Hgb urine dipstick: NEGATIVE
Ketones, ur: 5 mg/dL — AB
Nitrite: NEGATIVE
Protein, ur: NEGATIVE mg/dL
Specific Gravity, Urine: 1.005 (ref 1.005–1.030)
pH: 6 (ref 5.0–8.0)

## 2021-10-25 LAB — CBC WITH DIFFERENTIAL/PLATELET
Abs Immature Granulocytes: 0.02 10*3/uL (ref 0.00–0.07)
Basophils Absolute: 0 10*3/uL (ref 0.0–0.1)
Basophils Relative: 1 %
Eosinophils Absolute: 0.1 10*3/uL (ref 0.0–0.5)
Eosinophils Relative: 1 %
HCT: 37.8 % (ref 36.0–46.0)
Hemoglobin: 12.2 g/dL (ref 12.0–15.0)
Immature Granulocytes: 0 %
Lymphocytes Relative: 27 %
Lymphs Abs: 2.1 10*3/uL (ref 0.7–4.0)
MCH: 29 pg (ref 26.0–34.0)
MCHC: 32.3 g/dL (ref 30.0–36.0)
MCV: 90 fL (ref 80.0–100.0)
Monocytes Absolute: 0.4 10*3/uL (ref 0.1–1.0)
Monocytes Relative: 5 %
Neutro Abs: 5.1 10*3/uL (ref 1.7–7.7)
Neutrophils Relative %: 66 %
Platelets: 279 10*3/uL (ref 150–400)
RBC: 4.2 MIL/uL (ref 3.87–5.11)
RDW: 13.3 % (ref 11.5–15.5)
WBC: 7.7 10*3/uL (ref 4.0–10.5)
nRBC: 0 % (ref 0.0–0.2)

## 2021-10-25 LAB — BASIC METABOLIC PANEL
Anion gap: 9 (ref 5–15)
BUN: 12 mg/dL (ref 6–20)
CO2: 27 mmol/L (ref 22–32)
Calcium: 9.4 mg/dL (ref 8.9–10.3)
Chloride: 102 mmol/L (ref 98–111)
Creatinine, Ser: 0.66 mg/dL (ref 0.44–1.00)
GFR, Estimated: >60 mL/min (ref >60)
Glucose, Bld: 221 mg/dL — ABNORMAL HIGH (ref 70–99)
Potassium: 3.8 mmol/L (ref 3.5–5.1)
Sodium: 138 mmol/L (ref 135–145)

## 2021-10-25 LAB — CBG MONITORING, ED: Glucose-Capillary: 92 mg/dL (ref 70–99)

## 2021-10-25 LAB — HCG, QUANTITATIVE, PREGNANCY: hCG, Beta Chain, Quant, S: <1 m[IU]/mL (ref <5)

## 2021-10-25 LAB — POC URINE PREG, ED: Preg Test, Ur: POSITIVE — AB

## 2021-10-25 MED ORDER — FLUTICASONE PROPIONATE 50 MCG/ACT NA SUSP: 2.0000 | Freq: Every day | NASAL | 2 refills | Status: DC

## 2021-10-25 MED ORDER — METHOCARBAMOL 500 MG PO TABS: 500.0000 mg | ORAL_TABLET | Freq: Four times a day (QID) | ORAL | 0 refills | Status: DC | PRN

## 2021-10-25 MED ORDER — ONDANSETRON 4 MG PO TBDP
4.0000 mg | ORAL_TABLET | Freq: Once | ORAL | Status: AC
  Administered 2021-10-25: 4 mg via ORAL
  Filled 2021-10-25: qty 1

## 2021-10-25 MED ORDER — PREDNISONE 10 MG PO TABS: ORAL_TABLET | ORAL | 0 refills | Status: DC

## 2021-10-25 NOTE — Discharge Instructions (Addendum)
Call make an appointment with your primary care provider in approximately 3 to 4 weeks and have your blood sugar rechecked as it was elevated in the emergency department initially but was normal at 92 prior to your discharge.  A prescription for prednisone 3 tablets once a day for for 5 days should help with your eustachian tube dysfunction helping with your right ear and also helping with muscle aches.  You may take Tylenol along with this medication if additional pain pain medication as needed.  Muscle relaxant was also sent to the pharmacy to take every 6 hours if needed for muscle spasms.  Use ice or heat to your neck as needed for discomfort.  Do not drive or operate machinery while taking this medication as it could cause drowsiness.  The Flonase nasal spray is 2 sprays into each nostril daily which should help with your eustachian tube dysfunction as well.

## 2021-10-25 NOTE — ED Triage Notes (Signed)
Pt comes with c/o neck pain thought she was having panic attack at first but then she got sick on her stomach.  Pt also states intense neck pain. Pt states she just feels nauseated right now. Pt states pain is intense on both sides of necks and down back .

## 2021-10-25 NOTE — ED Provider Notes (Signed)
[facility] Provider Note    Event Date/Time   First MD Initiated Contact with Patient 10/25/21 1452     (approximate)   History   Neck Pain   HPI  ASTRYD PEARCY is a 36 y.o. female   presents to the ED with complaint of feeling as if she had a panic attack.  Patient reports that this morning she felt well however after lunch she was driving back to work and felt nauseous with some intense pain to her neck bilaterally and a headache.  Patient reports she feels that something "is not right".  She denies any recent injuries to her head or neck.  She has approximately 10 years ago had a injury to her head but CT scan was negative.  Patient denies any fever, chills, vomiting or diarrhea.  No urinary symptoms.      Physical Exam   Triage Vital Signs: ED Triage Vitals [10/25/21 1437]  Enc Vitals Group     BP (!) 147/94     Pulse Rate 86     Resp 18     Temp 98 F (36.7 C)     Temp src      SpO2 98 %     Weight      Height      Head Circumference      Peak Flow      Pain Score 5     Pain Loc      Pain Edu?      Excl. in GC?     Most recent vital signs: Vitals:   10/25/21 1437  BP: (!) 147/94  Pulse: 86  Resp: 18  Temp: 98 F (36.7 C)  SpO2: 98%     General: Awake, no distress.  Able to talk in complete sentences without any difficulty.  Patient appears anxious.  CV:  Good peripheral perfusion.  Heart regular rate and rhythm. Resp:  Normal effort.  Clear bilaterally. Abd:  No distention.  Soft, flat, and nontender on palpation.  Bowel sounds normoactive x4 quadrants. Other:  PERRLA, EOMs cranial nerves II through XII grossly intact, speech is normal.  Patient is able to ambulate without any assistance and is noted to be normal.  There is some diffuse generalized tenderness on palpation of the cervical muscles bilaterally and down into the trapezius muscles.  Patient is able to flex and extend her neck without any difficulty.  Noted on  exam TMs are dull with right TM fluid level present but no erythema or injection is noted.   ED Results / Procedures / Treatments   Labs (all labs ordered are listed, but only abnormal results are displayed) Labs Reviewed  BASIC METABOLIC PANEL - Abnormal; Notable for the following components:      Result Value   Glucose, Bld 221 (*)    All other components within normal limits  URINALYSIS, ROUTINE W REFLEX MICROSCOPIC - Abnormal; Notable for the following components:   Color, Urine STRAW (*)    APPearance HAZY (*)    Ketones, ur 5 (*)    Leukocytes,Ua MODERATE (*)    Bacteria, UA RARE (*)    All other components within normal limits  POC URINE PREG, ED - Abnormal; Notable for the following components:   Preg Test, Ur POSITIVE (*)    All other components within normal limits  CBC WITH DIFFERENTIAL/PLATELET  HCG, QUANTITATIVE, PREGNANCY  POC URINE PREG, ED  CBG MONITORING, ED      RADIOLOGY  Chest x-ray x-ray images were reviewed by myself independent of the radiologist and was negative for acute cardiopulmonary disease.  Radiology report is negative. CT scan without contrast of the head per radiologist is negative for intracranial changes and CT cervical spine is negative for compression fracture or acute injury.  There is loss of the cervical lordosis which may be positional or muscle spasms related to this.    PROCEDURES:  Critical Care performed:   Procedures   MEDICATIONS ORDERED IN ED: Medications  ondansetron (ZOFRAN-ODT) disintegrating tablet 4 mg (4 mg Oral Given 10/25/21 1516)     IMPRESSION / MDM / ASSESSMENT AND PLAN / ED COURSE  I reviewed the triage vital signs and the nursing notes.   Differential diagnosis includes, but is not limited to, generalized headache, cervical strain, muscle skeletal pain, eustachian tube dysfunction, otitis media, viral illness, anxiety, hyperventilation, hyperglycemia without history of diabetes.  36 year old female  presents to the ED with complaint of generalized headache and neck pain that started suddenly after eating lunch and driving back to work.  Patient denies any injury or recent illness.  WBC was 7.7 and reassuring, urinalysis was negative, urine pregnancy test was positive however beta hCG was less than 1.  BMP showed a glucose of 221 however prior to discharge glucose fingerstick was 92.  There is no history of prior diabetes and patient states that she had just eaten a sandwich.  We discussed this in length and she agrees to make an appointment with her PCP in 3 to 4 weeks and have her glucose level reevaluated.  Chest x-ray, CT head and cervical spine were reassuring and patient was made aware.  She agrees to take a short course of prednisone to help with the eustachian tube dysfunction as noted above which may be partially why her neck is hurting, methocarbamol and Flonase was sent to the pharmacy and patient was given a note to remain out of work tomorrow.  She is to follow-up with her PCP or return to the emergency department if any severe worsening of her symptoms.      Patient's presentation is most consistent with acute complicated illness / injury requiring diagnostic workup.  FINAL CLINICAL IMPRESSION(S) / ED DIAGNOSES   Final diagnoses:  Cervical pain  Generalized headache  Elevated glucose level  Eustachian tube dysfunction, right     Rx / DC Orders   ED Discharge Orders          Ordered    predniSONE (DELTASONE) 10 MG tablet        10/25/21 1756    fluticasone (FLONASE) 50 MCG/ACT nasal spray  Daily        10/25/21 1756    methocarbamol (ROBAXIN) 500 MG tablet  Every 6 hours PRN        10/25/21 1758             Note:  This document was prepared using Dragon voice recognition software and may include unintentional dictation errors.   Johnn Hai, PA-C 10/25/21 1806    Naaman Plummer, MD 10/25/21 [phone]

## 2021-10-25 NOTE — ED Notes (Signed)
Cbg 92 

## 2021-12-15 DIAGNOSIS — U071 COVID-19: Secondary | ICD-10-CM | POA: Diagnosis not present

## 2021-12-15 DIAGNOSIS — J101 Influenza due to other identified influenza virus with other respiratory manifestations: Secondary | ICD-10-CM | POA: Diagnosis not present

## 2022-07-31 ENCOUNTER — Other Ambulatory Visit: Payer: Self-pay | Admitting: Nurse Practitioner

## 2022-07-31 ENCOUNTER — Ambulatory Visit: Payer: BC Managed Care – PPO | Admitting: Nurse Practitioner

## 2022-07-31 ENCOUNTER — Encounter: Payer: Self-pay | Admitting: Nurse Practitioner

## 2022-07-31 ENCOUNTER — Other Ambulatory Visit: Payer: Self-pay

## 2022-07-31 VITALS — BP 110/76 | HR 91 | Temp 98.2°F | Resp 16 | Ht 63.0 in | Wt 175.3 lb

## 2022-07-31 DIAGNOSIS — Z131 Encounter for screening for diabetes mellitus: Secondary | ICD-10-CM | POA: Diagnosis not present

## 2022-07-31 DIAGNOSIS — Z1159 Encounter for screening for other viral diseases: Secondary | ICD-10-CM

## 2022-07-31 DIAGNOSIS — R7303 Prediabetes: Secondary | ICD-10-CM | POA: Diagnosis not present

## 2022-07-31 DIAGNOSIS — Z7689 Persons encountering health services in other specified circumstances: Secondary | ICD-10-CM

## 2022-07-31 DIAGNOSIS — E6609 Other obesity due to excess calories: Secondary | ICD-10-CM

## 2022-07-31 DIAGNOSIS — Z6831 Body mass index (BMI) 31.0-31.9, adult: Secondary | ICD-10-CM

## 2022-07-31 DIAGNOSIS — Z13220 Encounter for screening for lipoid disorders: Secondary | ICD-10-CM

## 2022-07-31 DIAGNOSIS — J4520 Mild intermittent asthma, uncomplicated: Secondary | ICD-10-CM

## 2022-07-31 DIAGNOSIS — R3915 Urgency of urination: Secondary | ICD-10-CM | POA: Diagnosis not present

## 2022-07-31 DIAGNOSIS — Z720 Tobacco use: Secondary | ICD-10-CM | POA: Insufficient documentation

## 2022-07-31 DIAGNOSIS — R634 Abnormal weight loss: Secondary | ICD-10-CM | POA: Diagnosis not present

## 2022-07-31 DIAGNOSIS — Z130 Encounter for screening for diseases of the blood and blood-forming organs and certain disorders involving the immune mechanism: Secondary | ICD-10-CM

## 2022-07-31 DIAGNOSIS — Z114 Encounter for screening for human immunodeficiency virus [HIV]: Secondary | ICD-10-CM

## 2022-07-31 LAB — POCT URINALYSIS DIPSTICK
Bilirubin, UA: NEGATIVE
Blood, UA: NEGATIVE
Glucose, UA: NEGATIVE
Ketones, UA: NEGATIVE
Leukocytes, UA: NEGATIVE
Nitrite, UA: NEGATIVE
Protein, UA: NEGATIVE
Spec Grav, UA: 1.01 (ref 1.010–1.025)
Urobilinogen, UA: 0.2 E.U./dL
pH, UA: 7 (ref 5.0–8.0)

## 2022-07-31 LAB — CBC WITH DIFFERENTIAL/PLATELET
Absolute Monocytes: 369 cells/uL (ref 200–950)
Basophils Absolute: 47 cells/uL (ref 0–200)
Basophils Relative: 0.7 %
Eosinophils Absolute: 228 cells/uL (ref 15–500)
Eosinophils Relative: 3.4 %
HCT: 39.2 % (ref 35.0–45.0)
Hemoglobin: 13.1 g/dL (ref 11.7–15.5)
Lymphs Abs: 2439 cells/uL (ref 850–3900)
MCH: 29.2 pg (ref 27.0–33.0)
MCHC: 33.4 g/dL (ref 32.0–36.0)
MCV: 87.3 fL (ref 80.0–100.0)
MPV: 10.9 fL (ref 7.5–12.5)
Monocytes Relative: 5.5 %
Neutro Abs: 3618 cells/uL (ref 1500–7800)
Neutrophils Relative %: 54 %
Platelets: 332 10*3/uL (ref 140–400)
RBC: 4.49 10*6/uL (ref 3.80–5.10)
RDW: 12.7 % (ref 11.0–15.0)
Total Lymphocyte: 36.4 %
WBC: 6.7 10*3/uL (ref 3.8–10.8)

## 2022-07-31 LAB — POCT GLYCOSYLATED HEMOGLOBIN (HGB A1C): Hemoglobin A1C: 5.8 % — AB (ref 4.0–5.6)

## 2022-07-31 MED ORDER — DARIFENACIN HYDROBROMIDE ER 7.5 MG PO TB24: 7.5000 mg | ORAL_TABLET | Freq: Every day | ORAL | 0 refills | Status: DC

## 2022-07-31 MED ORDER — METFORMIN HCL ER 500 MG PO TB24: 500.0000 mg | ORAL_TABLET | Freq: Every day | ORAL | 0 refills | Status: DC

## 2022-07-31 MED ORDER — AIRSUPRA 90-80 MCG/ACT IN AERO: 1.0000 | INHALATION_SPRAY | RESPIRATORY_TRACT | 5 refills | Status: DC | PRN

## 2022-07-31 NOTE — Progress Notes (Signed)
BP 110/76   Pulse 91   Temp 98.2 F (36.8 C) (Oral)   Resp 16   Ht 5\' 3"  (1.6 m)   Wt 175 lb 4.8 oz (79.5 kg)   LMP 07/28/2022   SpO2 98%   BMI 31.05 kg/m    Subjective:    Patient ID: Nichole Boyd, female    DOB: 03-12-1985, 37 y.o.   MRN: [id number]  HPI: Nichole Boyd is a 37 y.o. female  Chief Complaint  Patient presents with   Establish Care   Hyperglycemia    Bs running 125 to 275. Weight loss, frequent urination, thirsty   Asthma   Establish care: her last physical was years ago.  Medical history includes asthma.  Family history includes DM.  Health maintenance due for labs, pap.   Prediabetes: patient reports she has had some weight loss, increased urination and increased thirst. She says that it has not improved. Will start metformin 500 mg daily.   -Last A1c today 5.8 -Medications: none -Checking BG at home: 125-275 -Diet: recommend decreasing sugar and processed foods in your diet.  -Exercise: recommend 150 min of physical activity weekly   -Denies symptoms of hypoglycemia, numbness extremities, foot ulcers/trauma.    Urinary urgency/incontinence:she reports that for the last 6 months she has had urinary urgency.  She reports if she does not rush to the bathroom she could have an accident.  She denies any dysuria.  Urine dip negative. Discussed trying a medication for over active bladder or going to urology. Patient would like to try medication first. Will start darifenacin 7.5 mg daily.  Discussed side effects.     Obesity:  Current weight : 175 lbs BMI: 31.05 Highest weight:189 lbs Treatment Tried: eating healthier, and exercising  Comorbidities: prediabetes, asthma  Asthma:    -Asthma status: controlled -Current Treatments: albuterol  -Satisfied with current treatment?: yes -Albuterol/rescue inhaler frequency: 3 times a week during the summer, winter less often -Dyspnea frequency: 3 times a week -Limitation of activity: no -Current upper  respiratory symptoms: no -Triggers: humidity -Aerochamber/spacer use: no -Visits to ER or Urgent Care in past year: no -Pneumovax: Not up to Date -Influenza: Up to Date  - will trial airsupra    07/31/2022    1:57 PM 12/24/2019    4:14 PM 06/24/2019    3:21 PM 03/10/2019    4:08 PM 12/24/2018    3:11 PM  Depression screen PHQ 2/9  Decreased Interest 0 0 0 0 0  Down, Depressed, Hopeless 0 0 1 0 0  PHQ - 2 Score 0 0 1 0 0  Altered sleeping 1      Tired, decreased energy 0      Change in appetite 1      Feeling bad or failure about yourself  0      Trouble concentrating 0      Moving slowly or fidgety/restless 0      Suicidal thoughts 0      PHQ-9 Score 2      Difficult doing work/chores Not difficult at all         . Relevant past medical, surgical, family and social history reviewed and updated as indicated. Interim medical history since our last visit reviewed. Allergies and medications reviewed and updated.  Review of Systems  Constitutional: Negative for fever or weight change.  Respiratory: Negative for cough and shortness of breath.   Cardiovascular: Negative for chest pain or palpitations.  Gastrointestinal: Negative for abdominal pain,  no bowel changes.  Musculoskeletal: Negative for gait problem or joint swelling.  Skin: Negative for rash.  Neurological: Negative for dizziness or headache.  No other specific complaints in a complete review of systems (except as listed in HPI above).      Objective:    BP 110/76   Pulse 91   Temp 98.2 F (36.8 C) (Oral)   Resp 16   Ht 5\' 3"  (1.6 m)   Wt 175 lb 4.8 oz (79.5 kg)   LMP 07/28/2022   SpO2 98%   BMI 31.05 kg/m   Wt Readings from Last 3 Encounters:  07/31/22 175 lb 4.8 oz (79.5 kg)  12/24/19 179 lb 6.4 oz (81.4 kg)  09/10/19 170 lb (77.1 kg)    Physical Exam  Constitutional: Patient appears well-developed and well-nourished. Obese  No distress.  HEENT: head atraumatic, normocephalic, pupils equal and  reactive to light, neck supple Cardiovascular: Normal rate, regular rhythm and normal heart sounds.  No murmur heard. No BLE edema. Pulmonary/Chest: Effort normal and breath sounds normal. No respiratory distress. Abdominal: Soft.  There is no tenderness. Psychiatric: Patient has a normal mood and affect. behavior is normal. Judgment and thought content normal.   Results for orders placed or performed in visit on 07/31/22  POCT HgB A1C  Result Value Ref Range   Hemoglobin A1C 5.8 (A) 4.0 - 5.6 %   HbA1c POC (<> result, manual entry)     HbA1c, POC (prediabetic range)     HbA1c, POC (controlled diabetic range)    POCT urinalysis dipstick  Result Value Ref Range   Color, UA yellow    Clarity, UA clear    Glucose, UA Negative Negative   Bilirubin, UA negative    Ketones, UA negtaive    Spec Grav, UA 1.010 1.010 - 1.025   Blood, UA negative    pH, UA 7.0 5.0 - 8.0   Protein, UA Negative Negative   Urobilinogen, UA 0.2 0.2 or 1.0 E.U./dL   Nitrite, UA negative    Leukocytes, UA Negative Negative   Appearance clear    Odor none       Assessment & Plan:   Problem List Items Addressed This Visit       Respiratory   Mild intermittent asthma    Start air supra      Relevant Medications   Albuterol-Budesonide (AIRSUPRA) 90-80 MCG/ACT AERO     Other   Prediabetes - Primary    Blood sugars have been irregular.  A1c in prediabetic range. Will start metformin 500 mg daily      Relevant Medications   metFORMIN (GLUCOPHAGE-XR) 500 MG 24 hr tablet   Other Relevant Orders   COMPLETE METABOLIC PANEL WITH GFR   Insulin, Free (Bioactive)   Glutamic acid decarboxylase auto abs   Class 1 obesity due to excess calories with serious comorbidity and body mass index (BMI) of 31.0 to 31.9 in adult    Continue working on lifestyle modification      Relevant Medications   metFORMIN (GLUCOPHAGE-XR) 500 MG 24 hr tablet   Other Relevant Orders   TSH   Other Visit Diagnoses      Encounter to establish care       schedule cpe when due   Screening for HIV without presence of risk factors       Relevant Orders   HIV Antibody (routine testing w rflx)   Encounter for hepatitis C screening test for low risk patient  Relevant Orders   Hepatitis C antibody   Screening for deficiency anemia       Relevant Orders   CBC with Differential/Platelet   Screening for cholesterol level       Relevant Orders   Lipid panel   Screening for diabetes mellitus       Relevant Orders   POCT HgB A1C (Completed)   COMPLETE METABOLIC PANEL WITH GFR   Urinary urgency       start darifenacin 7.5 mg daily, if no improvement will refer to urology   Relevant Medications   darifenacin (ENABLEX) 7.5 MG 24 hr tablet   Other Relevant Orders   POCT urinalysis dipstick (Completed)        Follow up plan: Return in about 3 months (around 10/31/2022) for follow up.

## 2022-07-31 NOTE — Assessment & Plan Note (Signed)
Blood sugars have been irregular.  A1c in prediabetic range. Will start metformin 500 mg daily

## 2022-07-31 NOTE — Assessment & Plan Note (Signed)
Continue working on lifestyle modification.  

## 2022-07-31 NOTE — Assessment & Plan Note (Signed)
Start air supra

## 2022-08-01 ENCOUNTER — Encounter: Payer: Self-pay | Admitting: Nurse Practitioner

## 2022-08-01 NOTE — Telephone Encounter (Signed)
Pharmacy comment: Alternative Requested:NOT COVERED.

## 2022-08-02 ENCOUNTER — Other Ambulatory Visit: Payer: Self-pay | Admitting: Nurse Practitioner

## 2022-08-02 DIAGNOSIS — R7303 Prediabetes: Secondary | ICD-10-CM

## 2022-08-02 MED ORDER — FREESTYLE LIBRE 3 SENSOR MISC: 1.0000 | 5 refills | Status: DC

## 2022-08-24 ENCOUNTER — Other Ambulatory Visit: Payer: Self-pay | Admitting: Nurse Practitioner

## 2022-08-24 DIAGNOSIS — R3915 Urgency of urination: Secondary | ICD-10-CM

## 2022-08-24 DIAGNOSIS — R7303 Prediabetes: Secondary | ICD-10-CM

## 2022-08-25 NOTE — Telephone Encounter (Signed)
Requested Prescriptions  Pending Prescriptions Disp Refills   darifenacin (ENABLEX) 7.5 MG 24 hr tablet [Pharmacy Med Name: DARIFENACIN ER 7.5 MG TABLET] 90 tablet 0    Sig: TAKE 1 TABLET BY MOUTH EVERY DAY     Urology: Bladder Agents - darifenacin Passed - 08/24/2022 10:33 AM      Passed - AST in normal range and within 360 days    AST  Date Value Ref Range Status  07/31/2022 16 10 - 30 U/L Final   SGOT(AST)  Date Value Ref Range Status  06/02/2013 17 15 - 37 Unit/L Final         Passed - ALT in normal range and within 360 days    ALT  Date Value Ref Range Status  07/31/2022 18 6 - 29 U/L Final   SGPT (ALT)  Date Value Ref Range Status  06/02/2013 18 12 - 78 U/L Final         Passed - Valid encounter within last 12 months    Recent Outpatient Visits           3 weeks ago Prediabetes   [facility] Berniece Salines, FNP       Future Appointments             In 2 months Zane Herald, Rudolpho Sevin, FNP [facility]             metFORMIN (GLUCOPHAGE-XR) 500 MG 24 hr tablet [Pharmacy Med Name: METFORMIN HCL ER 500 MG TABLET] 90 tablet 0    Sig: TAKE 1 TABLET (500 MG TOTAL) BY MOUTH DAILY WITH LUNCH     Endocrinology:  Diabetes - Biguanides Failed - 08/24/2022 10:33 AM      Failed - B12 Level in normal range and within 720 days    No results found for: "VITAMINB12"       Passed - Cr in normal range and within 360 days    Creat  Date Value Ref Range Status  07/31/2022 0.83 0.50 - 0.97 mg/dL Final         Passed - HBA1C is between 0 and 7.9 and within 180 days    Hemoglobin A1C  Date Value Ref Range Status  07/31/2022 5.8 (A) 4.0 - 5.6 % Final         Passed - eGFR in normal range and within 360 days    EGFR (African American)  Date Value Ref Range Status  06/02/2013 >60  Final   GFR calc Af Amer  Date Value Ref Range Status  05/09/2018 >60 >60 mL/min Final   EGFR (Non-African Amer.)  Date Value Ref Range  Status  06/02/2013 >60  Final    Comment:    eGFR values <63mL/min/1.73 m2 may be an indication of chronic kidney disease (CKD). Calculated eGFR is useful in patients with stable renal function. The eGFR calculation will not be reliable in acutely ill patients when serum creatinine is changing rapidly. It is not useful in  patients on dialysis. The eGFR calculation may not be applicable to patients at the low and high extremes of body sizes, pregnant women, and vegetarians.    GFR, Estimated  Date Value Ref Range Status  10/25/2021 >60 >60 mL/min Final    Comment:    (NOTE) Calculated using the CKD-EPI Creatinine Equation (2021)    eGFR  Date Value Ref Range Status  07/31/2022 94 > OR = 60 mL/min/1.52m2 Final  Passed - Valid encounter within last 6 months    Recent Outpatient Visits           3 weeks ago Prediabetes   [facility] Berniece Salines, FNP       Future Appointments             In 2 months Zane Herald, Rudolpho Sevin, FNP [facility]            Passed - CBC within normal limits and completed in the last 12 months    WBC  Date Value Ref Range Status  07/31/2022 6.7 3.8 - 10.8 Thousand/uL Final   RBC  Date Value Ref Range Status  07/31/2022 4.49 3.80 - 5.10 Million/uL Final   Hemoglobin  Date Value Ref Range Status  07/31/2022 13.1 11.7 - 15.5 g/dL Final   HGB  Date Value Ref Range Status  06/02/2013 13.4 12.0 - 16.0 g/dL Final   HCT  Date Value Ref Range Status  07/31/2022 39.2 35.0 - 45.0 % Final  06/02/2013 39.6 35.0 - 47.0 % Final   MCHC  Date Value Ref Range Status  07/31/2022 33.4 32.0 - 36.0 g/dL Final   [facility]  Date Value Ref Range Status  07/31/2022 29.2 27.0 - 33.0 pg Final   MCV  Date Value Ref Range Status  07/31/2022 87.3 80.0 - 100.0 fL Final  06/02/2013 89 80 - 100 fL Final   No results found for: "PLTCOUNTKUC", "LABPLAT", "POCPLA" RDW  Date Value Ref Range Status   07/31/2022 12.7 11.0 - 15.0 % Final  06/02/2013 13.1 11.5 - 14.5 % Final

## 2022-11-01 ENCOUNTER — Ambulatory Visit (INDEPENDENT_AMBULATORY_CARE_PROVIDER_SITE_OTHER): Payer: BC Managed Care – PPO | Admitting: Nurse Practitioner

## 2022-11-01 ENCOUNTER — Encounter: Payer: Self-pay | Admitting: Nurse Practitioner

## 2022-11-01 ENCOUNTER — Other Ambulatory Visit: Payer: Self-pay

## 2022-11-01 VITALS — BP 118/72 | HR 94 | Temp 98.1°F | Resp 16 | Ht 63.0 in | Wt 171.7 lb

## 2022-11-01 DIAGNOSIS — R3915 Urgency of urination: Secondary | ICD-10-CM | POA: Diagnosis not present

## 2022-11-01 DIAGNOSIS — R0683 Snoring: Secondary | ICD-10-CM

## 2022-11-01 DIAGNOSIS — J4520 Mild intermittent asthma, uncomplicated: Secondary | ICD-10-CM

## 2022-11-01 DIAGNOSIS — E66811 Obesity, class 1: Secondary | ICD-10-CM | POA: Diagnosis not present

## 2022-11-01 DIAGNOSIS — E6609 Other obesity due to excess calories: Secondary | ICD-10-CM

## 2022-11-01 DIAGNOSIS — R7303 Prediabetes: Secondary | ICD-10-CM | POA: Diagnosis not present

## 2022-11-01 DIAGNOSIS — Z6831 Body mass index (BMI) 31.0-31.9, adult: Secondary | ICD-10-CM

## 2022-11-01 MED ORDER — FREESTYLE LIBRE 3 SENSOR MISC: 1.0000 | 5 refills | Status: DC

## 2022-11-01 MED ORDER — PIOGLITAZONE HCL 15 MG PO TABS: 15.0000 mg | ORAL_TABLET | Freq: Every day | ORAL | 1 refills | Status: AC

## 2022-11-01 MED ORDER — AIRSUPRA 90-80 MCG/ACT IN AERO: 1.0000 | INHALATION_SPRAY | RESPIRATORY_TRACT | 5 refills | Status: AC | PRN

## 2022-11-01 MED ORDER — ALBUTEROL SULFATE HFA 108 (90 BASE) MCG/ACT IN AERS: 2.0000 | INHALATION_SPRAY | Freq: Four times a day (QID) | RESPIRATORY_TRACT | 1 refills | Status: DC | PRN

## 2022-11-01 NOTE — Assessment & Plan Note (Signed)
Reports she is doing well on Airsupra.  Will continue current treatment plan.

## 2022-11-01 NOTE — Assessment & Plan Note (Signed)
Patient is currently working on diet and exercise.

## 2022-11-01 NOTE — Progress Notes (Signed)
BP 118/72   Pulse 94   Temp 98.1 F (36.7 C) (Oral)   Resp 16   Ht 5\' 3"  (1.6 m)   Wt 171 lb 11.2 oz (77.9 kg)   SpO2 98%   BMI 30.42 kg/m    Subjective:    Patient ID: Nichole Boyd, female    DOB: 1985/11/15, 37 y.o.   MRN: [id number]  HPI: Nichole Boyd is a 36 y.o. female  Chief Complaint  Patient presents with   Medical Management of Chronic Issues    3 month recheck   Prediabetes/ insulin resistence  -Last A1c 5.8 -Medications: metformin 500 mg daily, would like to try something else.  Will switch to actos.   -Patient is compliant with the above medications and reports diarrhea -Diet: reduce sugar and processed foods in your diet  -Exercise: recommend 150 min of physical activity weekly    -Denies symptoms of hypoglycemia, polyuria, polydipsia, numbness extremities, foot ulcers/trauma.    Urinary urgency/incontinence:she reports that for the last 6 months she has had urinary urgency.  She reports if she does not rush to the bathroom she could have an accident.  She denies any dysuria.  Urine dip negative. Discussed trying a medication for over active bladder or going to urology at last OV. Started patient on  darifenacin 7.5 mg daily.  Today patient reports unable to try the darifenacin yet, but she will try it now.   Obesity:  Current weight : 171 lbs BMI: 30.42 previous weight:175 lbs Treatment Tried: lifestyle modification Comorbidities: prediabetes, asthma   Asthma:    -Asthma status: controlled -Current Treatments: airsupra -Satisfied with current treatment?: yes -Albuterol/rescue inhaler frequency: 1 every few weeks -Dyspnea frequency:  1 every few weeks -Wheezing frequency: 1 every few weeks -Cough frequency:  1 every few weeks -Limitation of activity: no -Current upper respiratory symptoms: no -Triggers: weather, changing seasons -Asthma meds in past: albuterol -Aerochamber/spacer use: no -Visits to ER or Urgent Care in past year:  no -Pneumovax: Not up to Date -Influenza: Not up to Date       11/01/2022    3:11 PM 07/31/2022    1:57 PM 12/24/2019    4:14 PM 06/24/2019    3:21 PM 03/10/2019    4:08 PM  Depression screen PHQ 2/9  Decreased Interest 0 0 0 0 0  Down, Depressed, Hopeless 0 0 0 1 0  PHQ - 2 Score 0 0 0 1 0  Altered sleeping  1     Tired, decreased energy  0     Change in appetite  1     Feeling bad or failure about yourself   0     Trouble concentrating  0     Moving slowly or fidgety/restless  0     Suicidal thoughts  0     PHQ-9 Score  2     Difficult doing work/chores  Not difficult at all        Fatigue/snores: patient reports she has been having fatigue. She reports she does snore, will get sleep study.  At next appointment will get labs, labs previously done 3 months ago and were normal.   Relevant past medical, surgical, family and social history reviewed and updated as indicated. Interim medical history since our last visit reviewed. Allergies and medications reviewed and updated.  Review of Systems  Constitutional: Negative for fever or weight change.  Respiratory: Negative for cough and shortness of breath.   Cardiovascular: Negative for chest  pain or palpitations.  Gastrointestinal: Negative for abdominal pain, no bowel changes.  Musculoskeletal: Negative for gait problem or joint swelling.  Skin: Negative for rash.  Neurological: Negative for dizziness or headache.  No other specific complaints in a complete review of systems (except as listed in HPI above).      Objective:    BP 118/72   Pulse 94   Temp 98.1 F (36.7 C) (Oral)   Resp 16   Ht 5\' 3"  (1.6 m)   Wt 171 lb 11.2 oz (77.9 kg)   SpO2 98%   BMI 30.42 kg/m   Wt Readings from Last 3 Encounters:  11/01/22 171 lb 11.2 oz (77.9 kg)  07/31/22 175 lb 4.8 oz (79.5 kg)  12/24/19 179 lb 6.4 oz (81.4 kg)    Physical Exam  Constitutional: Patient appears well-developed and well-nourished. Obese  No distress.  HEENT:  head atraumatic, normocephalic, pupils equal and reactive to light, neck supple Cardiovascular: Normal rate, regular rhythm and normal heart sounds.  No murmur heard. No BLE edema. Pulmonary/Chest: Effort normal and breath sounds normal. No respiratory distress. Abdominal: Soft.  There is no tenderness. Psychiatric: Patient has a normal mood and affect. behavior is normal. Judgment and thought content normal.   Results for orders placed or performed in visit on 07/31/22  CBC with Differential/Platelet  Result Value Ref Range   WBC 6.7 3.8 - 10.8 Thousand/uL   RBC 4.49 3.80 - 5.10 Million/uL   Hemoglobin 13.1 11.7 - 15.5 g/dL   HCT 16.1 09.6 - 04.5 %   MCV 87.3 80.0 - 100.0 fL   MCH 29.2 27.0 - 33.0 pg   MCHC 33.4 32.0 - 36.0 g/dL   RDW 40.9 81.1 - 91.4 %   Platelets 332 140 - 400 Thousand/uL   MPV 10.9 7.5 - 12.5 fL   Neutro Abs 3,618 1,500 - 7,800 cells/uL   Lymphs Abs 2,439 850 - 3,900 cells/uL   Absolute Monocytes 369 200 - 950 cells/uL   Eosinophils Absolute 228 15 - 500 cells/uL   Basophils Absolute 47 0 - 200 cells/uL   Neutrophils Relative % 54 %   Total Lymphocyte 36.4 %   Monocytes Relative 5.5 %   Eosinophils Relative 3.4 %   Basophils Relative 0.7 %  COMPLETE METABOLIC PANEL WITH GFR  Result Value Ref Range   Glucose, Bld 94 65 - 99 mg/dL   BUN 19 7 - 25 mg/dL   Creat 7.82 9.56 - 2.13 mg/dL   eGFR 94 > OR = 60 YQ/MVH/[id number]   BUN/Creatinine Ratio SEE NOTE: 6 - 22 (calc)   Sodium 139 135 - 146 mmol/L   Potassium 4.1 3.5 - 5.3 mmol/L   Chloride 102 98 - 110 mmol/L   CO2 25 20 - 32 mmol/L   Calcium 9.8 8.6 - 10.2 mg/dL   Total Protein 7.3 6.1 - 8.1 g/dL   Albumin 4.7 3.6 - 5.1 g/dL   Globulin 2.6 1.9 - 3.7 g/dL (calc)   AG Ratio 1.8 1.0 - 2.5 (calc)   Total Bilirubin 0.3 0.2 - 1.2 mg/dL   Alkaline phosphatase (APISO) 51 31 - 125 U/L   AST 16 10 - 30 U/L   ALT 18 6 - 29 U/L  Lipid panel  Result Value Ref Range   Cholesterol 204 (H) <200 mg/dL   HDL 44 (L) >  OR = 50 mg/dL   Triglycerides 295 (H) <150 mg/dL   LDL Cholesterol (Calc) 127 (H) mg/dL (calc)   Total  CHOL/HDL Ratio 4.6 <5.0 (calc)   Non-HDL Cholesterol (Calc) 160 (H) <130 mg/dL (calc)  Hepatitis C antibody  Result Value Ref Range   Hepatitis C Ab NON-REACTIVE NON-REACTIVE  HIV Antibody (routine testing w rflx)  Result Value Ref Range   HIV 1&2 Ab, 4th Generation NON-REACTIVE NON-REACTIVE  TSH  Result Value Ref Range   TSH 1.58 mIU/L  Glutamic acid decarboxylase auto abs  Result Value Ref Range   Glutamic Acid Decarb Ab <5 <5 IU/mL  Insulin, Free (Bioactive)  Result Value Ref Range   Insulin, Free 16.4 (H) 1.5 - 14.9 uIU/mL  POCT HgB A1C  Result Value Ref Range   Hemoglobin A1C 5.8 (A) 4.0 - 5.6 %   HbA1c POC (<> result, manual entry)     HbA1c, POC (prediabetic range)     HbA1c, POC (controlled diabetic range)    POCT urinalysis dipstick  Result Value Ref Range   Color, UA yellow    Clarity, UA clear    Glucose, UA Negative Negative   Bilirubin, UA negative    Ketones, UA negtaive    Spec Grav, UA 1.010 1.010 - 1.025   Blood, UA negative    pH, UA 7.0 5.0 - 8.0   Protein, UA Negative Negative   Urobilinogen, UA 0.2 0.2 or 1.0 E.U./dL   Nitrite, UA negative    Leukocytes, UA Negative Negative   Appearance clear    Odor none       Assessment & Plan:   Problem List Items Addressed This Visit       Respiratory   Mild intermittent asthma    Reports she is doing well on Airsupra.  Will continue current treatment plan.      Relevant Medications   Albuterol-Budesonide (AIRSUPRA) 90-80 MCG/ACT AERO     Other   Prediabetes - Primary    Patient reports she did not tolerate metformin she says it gave her a lot of diarrhea.  Will switch to Actos.  Patient states she is also been really working on her diet.      Relevant Medications   pioglitazone (ACTOS) 15 MG tablet   Continuous Glucose Sensor (FREESTYLE LIBRE 3 SENSOR) MISC   Class 1 obesity due to excess  calories with serious comorbidity and body mass index (BMI) of 31.0 to 31.9 in adult    Patient is currently working on diet and exercise.      Relevant Medications   pioglitazone (ACTOS) 15 MG tablet   Other Visit Diagnoses     Urinary urgency       Has not had a chance to try darifenacin.  Patient has been start it today.  No improvement with medication will refer to urology.   Snores       Patient reports fatigue during the day.  She states she also snores at night.  Will get sleep study.  Referral placed to pulmonology.   Relevant Orders   Ambulatory referral to Pulmonology         Follow up plan: Return in about 4 months (around 03/02/2023) for follow up.

## 2022-11-01 NOTE — Assessment & Plan Note (Signed)
Patient reports she did not tolerate metformin she says it gave her a lot of diarrhea.  Will switch to Actos.  Patient states she is also been really working on her diet.

## 2023-03-01 NOTE — Progress Notes (Deleted)
 There were no vitals taken for this visit.   Subjective:    Patient ID: Nichole Boyd, female    DOB: [birth year], 38 y.o.   MRN: [id number]  HPI: Nichole Boyd is a 38 y.o. female  No chief complaint on file.   Discussed the use of AI scribe software for clinical note transcription with the patient, who gave verbal consent to proceed.  History of Present Illness           11/01/2022    3:11 PM 07/31/2022    1:57 PM 12/24/2019    4:14 PM  Depression screen PHQ 2/9  Decreased Interest 0 0 0  Down, Depressed, Hopeless 0 0 0  PHQ - 2 Score 0 0 0  Altered sleeping  1   Tired, decreased energy  0   Change in appetite  1   Feeling bad or failure about yourself   0   Trouble concentrating  0   Moving slowly or fidgety/restless  0   Suicidal thoughts  0   PHQ-9 Score  2   Difficult doing work/chores  Not difficult at all     Relevant past medical, surgical, family and social history reviewed and updated as indicated. Interim medical history since our last visit reviewed. Allergies and medications reviewed and updated.  Review of Systems  Per HPI unless specifically indicated above     Objective:    There were no vitals taken for this visit.  {Vitals History (Optional):23777} Wt Readings from Last 3 Encounters:  11/01/22 171 lb 11.2 oz (77.9 kg)  07/31/22 175 lb 4.8 oz (79.5 kg)  12/24/19 179 lb 6.4 oz (81.4 kg)    Physical Exam  Results for orders placed or performed in visit on 07/31/22  POCT HgB A1C   Collection Time: 07/31/22  2:01 PM  Result Value Ref Range   Hemoglobin A1C 5.8 (A) 4.0 - 5.6 %   HbA1c POC (<> result, manual entry)     HbA1c, POC (prediabetic range)     HbA1c, POC (controlled diabetic range)    POCT urinalysis dipstick   Collection Time: 07/31/22  2:36 PM  Result Value Ref Range   Color, UA yellow    Clarity, UA clear    Glucose, UA Negative Negative   Bilirubin, UA negative    Ketones, UA negtaive    Spec Grav, UA 1.010 1.010 -  1.025   Blood, UA negative    pH, UA 7.0 5.0 - 8.0   Protein, UA Negative Negative   Urobilinogen, UA 0.2 0.2 or 1.0 E.U./dL   Nitrite, UA negative    Leukocytes, UA Negative Negative   Appearance clear    Odor none   CBC with Differential/Platelet   Collection Time: 07/31/22  2:47 PM  Result Value Ref Range   WBC 6.7 3.8 - 10.8 Thousand/uL   RBC 4.49 3.80 - 5.10 Million/uL   Hemoglobin 13.1 11.7 - 15.5 g/dL   HCT 25.9 56.3 - 87.5 %   MCV 87.3 80.0 - 100.0 fL   MCH 29.2 27.0 - 33.0 pg   MCHC 33.4 32.0 - 36.0 g/dL   RDW 64.3 32.9 - 51.8 %   Platelets 332 140 - 400 Thousand/uL   MPV 10.9 7.5 - 12.5 fL   Neutro Abs 3,618 1,500 - 7,800 cells/uL   Lymphs Abs 2,439 850 - 3,900 cells/uL   Absolute Monocytes 369 200 - 950 cells/uL   Eosinophils Absolute 228 15 - 500 cells/uL  Basophils Absolute 47 0 - 200 cells/uL   Neutrophils Relative % 54 %   Total Lymphocyte 36.4 %   Monocytes Relative 5.5 %   Eosinophils Relative 3.4 %   Basophils Relative 0.7 %  COMPLETE METABOLIC PANEL WITH GFR   Collection Time: 07/31/22  2:47 PM  Result Value Ref Range   Glucose, Bld 94 65 - 99 mg/dL   BUN 19 7 - 25 mg/dL   Creat 0.45 4.09 - 8.11 mg/dL   eGFR 94 > OR = 60 BJ/YNW/2.95A2   BUN/Creatinine Ratio SEE NOTE: 6 - 22 (calc)   Sodium 139 135 - 146 mmol/L   Potassium 4.1 3.5 - 5.3 mmol/L   Chloride 102 98 - 110 mmol/L   CO2 25 20 - 32 mmol/L   Calcium 9.8 8.6 - 10.2 mg/dL   Total Protein 7.3 6.1 - 8.1 g/dL   Albumin 4.7 3.6 - 5.1 g/dL   Globulin 2.6 1.9 - 3.7 g/dL (calc)   AG Ratio 1.8 1.0 - 2.5 (calc)   Total Bilirubin 0.3 0.2 - 1.2 mg/dL   Alkaline phosphatase (APISO) 51 31 - 125 U/L   AST 16 10 - 30 U/L   ALT 18 6 - 29 U/L  Lipid panel   Collection Time: 07/31/22  2:47 PM  Result Value Ref Range   Cholesterol 204 (H) <200 mg/dL   HDL 44 (L) > OR = 50 mg/dL   Triglycerides 130 (H) <150 mg/dL   LDL Cholesterol (Calc) 127 (H) mg/dL (calc)   Total CHOL/HDL Ratio 4.6 <5.0 (calc)    Non-HDL Cholesterol (Calc) 160 (H) <130 mg/dL (calc)  Hepatitis C antibody   Collection Time: 07/31/22  2:47 PM  Result Value Ref Range   Hepatitis C Ab NON-REACTIVE NON-REACTIVE  HIV Antibody (routine testing w rflx)   Collection Time: 07/31/22  2:47 PM  Result Value Ref Range   HIV 1&2 Ab, 4th Generation NON-REACTIVE NON-REACTIVE  TSH   Collection Time: 07/31/22  2:47 PM  Result Value Ref Range   TSH 1.58 mIU/L  Glutamic acid decarboxylase auto abs   Collection Time: 07/31/22  2:47 PM  Result Value Ref Range   Glutamic Acid Decarb Ab <5 <5 IU/mL  Insulin, Free (Bioactive)   Collection Time: 07/31/22  2:47 PM  Result Value Ref Range   Insulin, Free 16.4 (H) 1.5 - 14.9 uIU/mL   {Labs (Optional):23779}    Assessment & Plan:   Problem List Items Addressed This Visit   None    Assessment and Plan             Follow up plan: No follow-ups on file.

## 2023-03-02 ENCOUNTER — Ambulatory Visit: Payer: BC Managed Care – PPO | Admitting: Nurse Practitioner

## 2023-03-29 ENCOUNTER — Other Ambulatory Visit: Payer: Self-pay

## 2023-03-29 ENCOUNTER — Emergency Department
Admission: EM | Admit: 2023-03-29 | Discharge: 2023-03-29 | Discharge status: Against Medical Advice | Attending: Emergency Medicine | Admitting: Emergency Medicine

## 2023-03-29 ENCOUNTER — Encounter: Payer: Self-pay | Admitting: *Deleted

## 2023-03-29 DIAGNOSIS — R110 Nausea: Secondary | ICD-10-CM | POA: Diagnosis not present

## 2023-03-29 DIAGNOSIS — M25512 Pain in left shoulder: Secondary | ICD-10-CM | POA: Diagnosis not present

## 2023-03-29 DIAGNOSIS — R6884 Jaw pain: Secondary | ICD-10-CM | POA: Insufficient documentation

## 2023-03-29 DIAGNOSIS — Z880 Allergy status to penicillin: Secondary | ICD-10-CM | POA: Diagnosis not present

## 2023-03-29 DIAGNOSIS — R079 Chest pain, unspecified: Secondary | ICD-10-CM | POA: Diagnosis not present

## 2023-03-29 DIAGNOSIS — R519 Headache, unspecified: Secondary | ICD-10-CM | POA: Insufficient documentation

## 2023-03-29 DIAGNOSIS — R7303 Prediabetes: Secondary | ICD-10-CM | POA: Diagnosis not present

## 2023-03-29 DIAGNOSIS — Y92410 Unspecified street and highway as the place of occurrence of the external cause: Secondary | ICD-10-CM | POA: Insufficient documentation

## 2023-03-29 DIAGNOSIS — S4992XA Unspecified injury of left shoulder and upper arm, initial encounter: Secondary | ICD-10-CM | POA: Diagnosis not present

## 2023-03-29 DIAGNOSIS — S59902A Unspecified injury of left elbow, initial encounter: Secondary | ICD-10-CM | POA: Diagnosis not present

## 2023-03-29 DIAGNOSIS — J45909 Unspecified asthma, uncomplicated: Secondary | ICD-10-CM | POA: Diagnosis not present

## 2023-03-29 DIAGNOSIS — S29009A Unspecified injury of muscle and tendon of unspecified wall of thorax, initial encounter: Secondary | ICD-10-CM | POA: Diagnosis not present

## 2023-03-29 DIAGNOSIS — R42 Dizziness and giddiness: Secondary | ICD-10-CM | POA: Diagnosis not present

## 2023-03-29 DIAGNOSIS — Z5321 Procedure and treatment not carried out due to patient leaving prior to being seen by health care provider: Secondary | ICD-10-CM | POA: Diagnosis not present

## 2023-03-29 DIAGNOSIS — Z87891 Personal history of nicotine dependence: Secondary | ICD-10-CM | POA: Diagnosis not present

## 2023-03-29 DIAGNOSIS — M25519 Pain in unspecified shoulder: Secondary | ICD-10-CM | POA: Insufficient documentation

## 2023-03-29 DIAGNOSIS — F17210 Nicotine dependence, cigarettes, uncomplicated: Secondary | ICD-10-CM | POA: Diagnosis not present

## 2023-03-29 HISTORY — DX: Type 2 diabetes mellitus without complications: E11.9
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
## 2023-03-30 DIAGNOSIS — R42 Dizziness and giddiness: Secondary | ICD-10-CM | POA: Diagnosis not present

## 2023-04-26 ENCOUNTER — Ambulatory Visit: Payer: Self-pay

## 2023-04-26 NOTE — Telephone Encounter (Signed)
 Copied from CRM [id number]. Topic: Clinical - Medical Advice >> Apr 26, 2023 11:14 AM Rosaria Common wrote: Reason for CRM: Pt is having SOB due to flu like symptoms she can feel in her chest. Pt also has asthma.  Chief Complaint: sob with cough, green mucous Symptoms: see above Frequency: constant Pertinent Negatives: Patient denies fever, cp Disposition: [] ED /[] Urgent Care (no appt availability in office) / [x] Appointment(In office/virtual)/ []  [facility]/ [] Home Care/ [] Refused Recommended Disposition /[] [facility]/ []  Follow-up with PCP Additional Notes: per protocol apt set for tomorrow.  Care advice given, denies questions; instructed to go to ER if becomes worse.   Reason for Disposition  [1] MILD difficulty breathing (e.g., minimal/no SOB at rest, SOB with walking, pulse <100) AND [2] NEW-onset or WORSE than normal  Answer Assessment - Initial Assessment Questions 1. RESPIRATORY STATUS: "Describe your breathing?" (e.g., wheezing, shortness of breath, unable to speak, severe coughing)      Shortness of breath, 2. ONSET: "When did this breathing problem begin?"      Last Friday 3. PATTERN "Does the difficult breathing come and go, or has it been constant since it started?"      Difficultly breathing with coughing 4. SEVERITY: "How bad is your breathing?" (e.g., mild, moderate, severe)    - MILD: No SOB at rest, mild SOB with walking, speaks normally in sentences, can lie down, no retractions, pulse < 100.    - MODERATE: SOB at rest, SOB with minimal exertion and prefers to sit, cannot lie down flat, speaks in phrases, mild retractions, audible wheezing, pulse 100-120.    - SEVERE: Very SOB at rest, speaks in single words, struggling to breathe, sitting hunched forward, retractions, pulse > 120      moderate 5. RECURRENT SYMPTOM: "Have you had difficulty breathing before?" If Yes, ask: "When was the last time?" and "What happened that time?"      Hx asthma 6.  CARDIAC HISTORY: "Do you have any history of heart disease?" (e.g., heart attack, angina, bypass surgery, angioplasty)      denies 7. LUNG HISTORY: "Do you have any history of lung disease?"  (e.g., pulmonary embolus, asthma, emphysema)     asthma 8. CAUSE: "What do you think is causing the breathing problem?"      cold 9. OTHER SYMPTOMS: "Do you have any other symptoms? (e.g., dizziness, runny nose, cough, chest pain, fever)     Runny nose, green mucous.  10. O2 SATURATION MONITOR:  "Do you use an oxygen saturation monitor (pulse oximeter) at home?" If Yes, ask: "What is your reading (oxygen level) today?" "What is your usual oxygen saturation reading?" (e.g., 95%)       On watch; 94% yesterday, 97% today 11. PREGNANCY: "Is there any chance you are pregnant?" "When was your last menstrual period?"       no 12. TRAVEL: "Have you traveled out of the country in the last month?" (e.g., travel history, exposures)       no  Protocols used: Breathing Difficulty-A-AH

## 2023-04-27 ENCOUNTER — Ambulatory Visit
Admission: RE | Admit: 2023-04-27 | Discharge: 2023-04-27 | Disposition: A | Discharge status: Home / Self Care | Attending: Internal Medicine | Admitting: Internal Medicine

## 2023-04-27 ENCOUNTER — Ambulatory Visit: Payer: Self-pay

## 2023-04-27 ENCOUNTER — Ambulatory Visit: Admitting: Internal Medicine

## 2023-04-27 ENCOUNTER — Ambulatory Visit
Admission: RE | Admit: 2023-04-27 | Discharge: 2023-04-27 | Disposition: A | Discharge status: Home / Self Care | Source: Ambulatory Visit | Attending: Internal Medicine | Admitting: Internal Medicine

## 2023-04-27 ENCOUNTER — Other Ambulatory Visit: Payer: Self-pay

## 2023-04-27 ENCOUNTER — Encounter: Payer: Self-pay | Admitting: Internal Medicine

## 2023-04-27 VITALS — BP 118/76 | HR 95 | Temp 98.4°F | Resp 18 | Ht 63.0 in | Wt 174.3 lb

## 2023-04-27 DIAGNOSIS — R051 Acute cough: Secondary | ICD-10-CM | POA: Diagnosis not present

## 2023-04-27 DIAGNOSIS — J40 Bronchitis, not specified as acute or chronic: Secondary | ICD-10-CM | POA: Insufficient documentation

## 2023-04-27 DIAGNOSIS — R059 Cough, unspecified: Secondary | ICD-10-CM | POA: Diagnosis not present

## 2023-04-27 LAB — POC COVID19/FLU A&B COMBO
Covid Antigen, POC: NEGATIVE
Influenza A Antigen, POC: NEGATIVE
Influenza B Antigen, POC: NEGATIVE

## 2023-04-27 MED ORDER — METHYLPREDNISOLONE 4 MG PO TBPK: ORAL_TABLET | ORAL | 0 refills | Status: AC

## 2023-04-27 MED ORDER — BENZONATATE 100 MG PO CAPS: 200.0000 mg | ORAL_CAPSULE | Freq: Three times a day (TID) | ORAL | 1 refills | Status: AC | PRN
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
## 2023-04-27 NOTE — Progress Notes (Signed)
 Acute Office Visit  Subjective:     Patient ID: Nichole Boyd, female    DOB: May 25, 1985, 38 y.o.   MRN: [id number]  Chief Complaint  Patient presents with   URI    Cough, sob, runny nose, congestion, bodyaches for 1 week    HPI Patient is in today for URI symptoms.   Discussed the use of AI scribe software for clinical note transcription with the patient, who gave verbal consent to proceed.  History of Present Illness The patient, with a history of asthma, presents with a week-long illness characterized by a productive cough, shortness of breath, wheezing, and fevers. The cough is productive of green sputum. The fevers have been as high as 101.64F, but are responsive to antipyretics. The shortness of breath is both at rest and with exertion, and is not relieved by her rescue inhaler. She has been using her Air Supra inhaler daily for the past week, but is hesitant to use it more frequently due to concerns about developing thrush. She also reports nasal congestion with green discharge, body aches, sinus pressure, and bilateral ear pain with a sensation of fullness and popping. She has been taking over-the-counter Mucinex  for the past week for her symptoms. She has been in contact with sick individuals at home. She has tested negative for COVID-19.   Review of Systems  Constitutional:  Positive for chills, fever and malaise/fatigue.  HENT:  Positive for congestion. Negative for sinus pain and sore throat.   Respiratory:  Positive for cough, sputum production and shortness of breath.         Objective:    BP 118/76 (Cuff Size: Large)   Pulse 95   Temp 98.4 F (36.9 C) (Oral)   Resp 18   Ht 5\' 3"  (1.6 m)   Wt 174 lb 4.8 oz (79.1 kg)   LMP 02/15/2023   SpO2 98%   BMI 30.88 kg/m  BP Readings from Last 3 Encounters:  04/27/23 118/76  03/29/23 139/74  11/01/22 118/72   Wt Readings from Last 3 Encounters:  04/27/23 174 lb 4.8 oz (79.1 kg)  11/01/22 171 lb 11.2 oz (77.9  kg)  07/31/22 175 lb 4.8 oz (79.5 kg)      Physical Exam Constitutional:      Appearance: She is ill-appearing.  HENT:     Head: Normocephalic and atraumatic.     Right Ear: Ear canal and external ear normal.     Left Ear: Ear canal and external ear normal.     Ears:     Comments: Bilateral eustachian tube dysfunction, worse on the left    Nose: Congestion present.     Mouth/Throat:     Mouth: Mucous membranes are moist.     Pharynx: Oropharynx is clear.  Eyes:     Conjunctiva/sclera: Conjunctivae normal.  Cardiovascular:     Rate and Rhythm: Normal rate and regular rhythm.  Pulmonary:     Effort: Pulmonary effort is normal.     Breath sounds: No wheezing, rhonchi or rales.     Comments: Decreased air movement throughout  Skin:    General: Skin is warm and dry.  Neurological:     General: No focal deficit present.     Mental Status: She is alert. Mental status is at baseline.  Psychiatric:        Mood and Affect: Mood normal.        Behavior: Behavior normal.     Results for orders placed or performed  in visit on 04/27/23  POC Covid19/Flu A&B Antigen  Result Value Ref Range   Influenza A Antigen, POC Negative Negative   Influenza B Antigen, POC Negative Negative   Covid Antigen, POC Negative Negative        Assessment & Plan:   Assessment & Plan Viral upper respiratory infection Symptoms consistent with viral infection. COVID-19 negative. Differential includes rhinovirus and parainfluenza. No bacterial infection signs. Steroids prescribed to reduce inflammation. - Order chest x-ray to rule out pneumonia. - Prescribe Medrol Dosepak. - Prescribe cough suppressant. - Advise discontinuation of over-the-counter decongestant. - Recommend rest, hydration, vitamin C, and zinc.  Asthma exacerbation Exacerbation likely triggered by viral infection. Symptoms include shortness of breath and wheezing. Steroids expected to decrease airway inflammation. Discussed increased  use of Airsupra  inhaler with post-use rinsing. - Advise increased use of Airsupra  inhaler with post-use rinsing.  Prediabetes Prediabetes with elevated blood glucose levels, likely exacerbated by illness and medication. A1c was 5.8% in July 2024. Discussed potential for steroids to increase blood glucose levels. - Monitor blood glucose levels closely, especially with steroid use. - Schedule follow-up appointment for diabetic management and A1c testing.  - POC Covid19/Flu A&B Antigen - DG Chest 2 View; Future - methylPREDNISolone (MEDROL DOSEPAK) 4 MG TBPK tablet; Use as directed.  Dispense: 21 each; Refill: 0 - DG Chest 2 View; Future - benzonatate  (TESSALON  PERLES) 100 MG capsule; Take 2 capsules (200 mg total) by mouth 3 (three) times daily as needed for cough.  Dispense: 20 capsule; Refill: 1   Return in about 2 weeks (around 05/11/2023).  Rockney Cid, DO

## 2023-05-03 ENCOUNTER — Encounter: Payer: Self-pay | Admitting: Internal Medicine

## 2023-05-11 ENCOUNTER — Ambulatory Visit: Admitting: Nurse Practitioner

## 2023-05-11 NOTE — Progress Notes (Deleted)
   There were no vitals taken for this visit.   Subjective:    Patient ID: Georganne Kind, female    DOB: October 23, 1985, 38 y.o.   MRN: [id number]  HPI: TYNIYA MAIZE is a 38 y.o. female  No chief complaint on file.   Discussed the use of AI scribe software for clinical note transcription with the patient, who gave verbal consent to proceed.  History of Present Illness          11/01/2022    3:11 PM 07/31/2022    1:57 PM 12/24/2019    4:14 PM  Depression screen PHQ 2/9  Decreased Interest 0 0 0  Down, Depressed, Hopeless 0 0 0  PHQ - 2 Score 0 0 0  Altered sleeping  1   Tired, decreased energy  0   Change in appetite  1   Feeling bad or failure about yourself   0   Trouble concentrating  0   Moving slowly or fidgety/restless  0   Suicidal thoughts  0   PHQ-9 Score  2   Difficult doing work/chores  Not difficult at all     Relevant past medical, surgical, family and social history reviewed and updated as indicated. Interim medical history since our last visit reviewed. Allergies and medications reviewed and updated.  Review of Systems  Per HPI unless specifically indicated above     Objective:      There were no vitals taken for this visit.  {Vitals History (Optional):23777} Wt Readings from Last 3 Encounters:  04/27/23 174 lb 4.8 oz (79.1 kg)  11/01/22 171 lb 11.2 oz (77.9 kg)  07/31/22 175 lb 4.8 oz (79.5 kg)    Physical Exam Physical Exam    Results for orders placed or performed in visit on 04/27/23  POC Covid19/Flu A&B Antigen   Collection Time: 04/27/23 10:05 AM  Result Value Ref Range   Influenza A Antigen, POC Negative Negative   Influenza B Antigen, POC Negative Negative   Covid Antigen, POC Negative Negative   {Labs (Optional):23779}       Assessment & Plan:   Problem List Items Addressed This Visit   None    Assessment and Plan Assessment & Plan         Follow up plan: No follow-ups on file.

## 2023-08-03 ENCOUNTER — Other Ambulatory Visit: Payer: Self-pay | Admitting: Nurse Practitioner

## 2023-08-03 DIAGNOSIS — R7303 Prediabetes: Secondary | ICD-10-CM

## 2023-08-06 NOTE — Telephone Encounter (Signed)
 Requested Prescriptions  Pending Prescriptions Disp Refills   Continuous Glucose Sensor (FREESTYLE LIBRE 3 SENSOR) MISC [Pharmacy Med Name: FREESTYLE LIBRE 3 SENSOR] 1 each 2    Sig: 1 EACH BY DOES NOT APPLY ROUTE EVERY 14 (FOURTEEN) DAYS. PLACE 1 SENSOR ON THE SKIN EVERY 14 DAYS. USE TO CHECK GLUCOSE CONTINUOUSLY     Endocrinology: Diabetes - Testing Supplies Failed - 08/06/2023 11:31 AM      Failed - Valid encounter within last 12 months    Recent Outpatient Visits           3 months ago Bronchitis   [facility] [place], [place]
